# Patient Record
Sex: Male | Born: 1975 | Race: Black or African American | Hispanic: No | Smoking: Former smoker
Health system: Southern US, Community
[De-identification: ages and names within clinical notes are randomized; demographics above are authoritative.]

## PROBLEM LIST (undated history)

## (undated) DIAGNOSIS — E785 Hyperlipidemia, unspecified: Secondary | ICD-10-CM

## (undated) DIAGNOSIS — E119 Type 2 diabetes mellitus without complications: Secondary | ICD-10-CM

## (undated) DIAGNOSIS — I10 Essential (primary) hypertension: Secondary | ICD-10-CM

## (undated) DIAGNOSIS — H5712 Ocular pain, left eye: Secondary | ICD-10-CM

## (undated) HISTORY — DX: Hyperlipidemia, unspecified: E78.5

## (undated) HISTORY — PX: HERNIA REPAIR: SHX51

## (undated) HISTORY — DX: Essential (primary) hypertension: I10

## (undated) HISTORY — DX: Type 2 diabetes mellitus without complications: E11.9

---

## 2016-11-15 ENCOUNTER — Encounter (HOSPITAL_COMMUNITY): Payer: Self-pay | Admitting: Emergency Medicine

## 2016-11-15 ENCOUNTER — Emergency Department (HOSPITAL_COMMUNITY)
Admission: EM | Admit: 2016-11-15 | Discharge: 2016-11-15 | Disposition: A | Discharge status: Home / Self Care | Payer: PRIVATE HEALTH INSURANCE | Attending: Emergency Medicine | Admitting: Emergency Medicine

## 2016-11-15 DIAGNOSIS — Y929 Unspecified place or not applicable: Secondary | ICD-10-CM | POA: Diagnosis not present

## 2016-11-15 DIAGNOSIS — Y999 Unspecified external cause status: Secondary | ICD-10-CM | POA: Insufficient documentation

## 2016-11-15 DIAGNOSIS — Z87891 Personal history of nicotine dependence: Secondary | ICD-10-CM | POA: Diagnosis not present

## 2016-11-15 DIAGNOSIS — X58XXXA Exposure to other specified factors, initial encounter: Secondary | ICD-10-CM | POA: Insufficient documentation

## 2016-11-15 DIAGNOSIS — Y939 Activity, unspecified: Secondary | ICD-10-CM | POA: Insufficient documentation

## 2016-11-15 DIAGNOSIS — S0502XA Injury of conjunctiva and corneal abrasion without foreign body, left eye, initial encounter: Secondary | ICD-10-CM | POA: Diagnosis not present

## 2016-11-15 DIAGNOSIS — H5712 Ocular pain, left eye: Secondary | ICD-10-CM | POA: Diagnosis present

## 2016-11-15 HISTORY — DX: Ocular pain, left eye: H57.12

## 2016-11-15 MED ORDER — FLUORESCEIN SODIUM 0.6 MG OP STRP: 1.0000 | ORAL_STRIP | Freq: Once | OPHTHALMIC | Status: DC

## 2016-11-15 MED ORDER — TOBRAMYCIN 0.3 % OP SOLN: 1.0000 [drp] | OPHTHALMIC | 0 refills | Status: DC

## 2016-11-15 NOTE — ED Provider Notes (Signed)
[facility]EMERGENCY DEPT Provider Note   CSN: [id number] Arrival date & time: 11/15/16  1054     History   Chief Complaint Chief Complaint  Patient presents with  . Eye Pain    HPI Nicholas Bradley is a 41 y.o. male.  The history is provided by the patient.  Eye Pain  This is a new problem. The problem occurs constantly. The problem has not changed since onset.Nothing aggravates the symptoms. Nothing relieves the symptoms. He has tried nothing for the symptoms.   Pt reports he has redness and pain to his left eye.  Pt complains of tearing, increased pain in the light.  Pt works on cars.  Pt admits to possible foreign body exposure Past Medical History:  Diagnosis Date  . Left eye pain     There are no active problems to display for this patient.   Past Surgical History:  Procedure Laterality Date  . HERNIA REPAIR         Home Medications    Prior to Admission medications   Medication Sig Start Date End Date Taking? Authorizing Provider  tobramycin (TOBREX) 0.3 % ophthalmic solution Place 1 drop into the left eye every 4 (four) hours. 11/15/16   Elson Areas, PA-C    Family History History reviewed. No pertinent family history.  Social History Social History  Substance Use Topics  . Smoking status: Former Smoker    Quit date: 11/14/2014  . Smokeless tobacco: Never Used  . Alcohol use Not on file     Comment: quit 2013     Allergies   Patient has no allergy information on record.   Review of Systems Review of Systems  Eyes: Positive for pain.  All other systems reviewed and are negative.    Physical Exam Updated Vital Signs BP (!) 136/97 (BP Location: Right Arm)   Pulse 78   Temp 98.8 F (37.1 C) (Oral)   Resp 18   Wt 129.3 kg (285 lb)   SpO2 98%   Physical Exam  Constitutional: He appears well-developed and well-nourished.  HENT:  Head: Normocephalic and atraumatic.  Eyes: Conjunctivae are normal. Right eye exhibits discharge.  Injected,  tearing, fluro small area of uptake at 6 oclock   Neck: Neck supple.  Cardiovascular: Normal rate and regular rhythm.   No murmur heard. Pulmonary/Chest: Effort normal and breath sounds normal. No respiratory distress.  Abdominal: There is no tenderness.  Musculoskeletal: He exhibits no edema.  Neurological: He is alert.  Skin: Skin is warm and dry.  Psychiatric: He has a normal mood and affect.  Nursing note and vitals reviewed.    ED Treatments / Results  Labs (all labs ordered are listed, but only abnormal results are displayed) Labs Reviewed - No data to display  EKG  EKG Interpretation None       Radiology No results found.  Procedures Procedures (including critical care time)  Medications Ordered in ED Medications  fluorescein ophthalmic strip 1 strip (not administered)     Initial Impression / Assessment and Plan / ED Course  I have reviewed the triage vital signs and the nursing notes.  Pertinent labs & imaging results that were available during my care of the patient were reviewed by me and considered in my medical decision making (see chart for details).       Final Clinical Impressions(s) / ED Diagnoses   Final diagnoses:  Abrasion of left cornea, initial encounter    New Prescriptions New Prescriptions   TOBRAMYCIN (  TOBREX) 0.3 % OPHTHALMIC SOLUTION    Place 1 drop into the left eye every 4 (four) hours.   An After Visit Summary was printed and given to the patient.    Elson AreasSofia, Madason Rauls K, [place]PA-C 11/15/16 1155    Doug SouJacubowitz, Sam, MD 11/15/16 [phone]

## 2016-11-15 NOTE — Discharge Instructions (Signed)
Return if any problems.

## 2017-05-22 ENCOUNTER — Other Ambulatory Visit: Payer: Self-pay

## 2017-05-22 ENCOUNTER — Encounter (HOSPITAL_COMMUNITY): Payer: Self-pay | Admitting: *Deleted

## 2017-05-22 ENCOUNTER — Emergency Department (HOSPITAL_COMMUNITY): Payer: PRIVATE HEALTH INSURANCE

## 2017-05-22 ENCOUNTER — Emergency Department (HOSPITAL_COMMUNITY)
Admission: EM | Admit: 2017-05-22 | Discharge: 2017-05-22 | Disposition: A | Discharge status: Home / Self Care | Payer: PRIVATE HEALTH INSURANCE | Attending: Emergency Medicine | Admitting: Emergency Medicine

## 2017-05-22 DIAGNOSIS — Z87891 Personal history of nicotine dependence: Secondary | ICD-10-CM | POA: Insufficient documentation

## 2017-05-22 DIAGNOSIS — R202 Paresthesia of skin: Secondary | ICD-10-CM | POA: Insufficient documentation

## 2017-05-22 DIAGNOSIS — M79642 Pain in left hand: Secondary | ICD-10-CM | POA: Diagnosis present

## 2017-05-22 NOTE — ED Provider Notes (Signed)
[facility] EMERGENCY DEPARTMENT Provider Note   CSN: [id number] Arrival date & time: 05/22/17  0013     History   Chief Complaint Chief Complaint  Patient presents with  . Hand Pain    HPI Nicholas Bradley is a 41 y.o. male.  The history is provided by the patient.  Hand Pain  This is a recurrent problem. The current episode started more than 1 week ago. The problem has been gradually worsening. Pertinent negatives include no chest pain and no shortness of breath. Nothing aggravates the symptoms. Nothing relieves the symptoms.  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 Left eye pain     There are no active problems to display for this patient.   Past Surgical History:  Procedure Laterality Date  . HERNIA REPAIR         Home Medications    Prior to Admission medications   Medication Sig Start Date End Date Taking? Authorizing Provider  tobramycin (TOBREX) 0.3 % ophthalmic solution Place 1 drop into the left eye every 4 (four) hours. 11/15/16   Elson AreasSofia, Leslie K, PA-C    Family History History reviewed. No pertinent family history.  Social History Social History   Tobacco Use  . Smoking status: Former Smoker    Last attempt to quit: 11/14/2014    Years since quitting: 2.5  . Smokeless tobacco: Never Used  Substance Use Topics  . Alcohol use: No    Frequency: Never    Comment: quit 2013  . Drug use: No     Allergies   Patient has no known allergies.   Review of Systems Review of Systems  Constitutional: Negative for fever.    Respiratory: Negative for shortness of breath.   Cardiovascular: Negative for chest pain.  Neurological: Positive for numbness.     Physical Exam Updated Vital Signs BP 122/85 (BP Location: Right Arm)   Pulse 70   Temp 98 F (36.7 C) (Oral)   Resp 18   Ht 1.803 m (5\' 11" )   Wt 127 kg (280 lb)   SpO2 98%   BMI 39.05 kg/m   Physical Exam CONSTITUTIONAL: Well developed/well nourished HEAD: Normocephalic/atraumatic ENMT: Mucous membranes moist NECK: supple no meningeal signs CV: S1/S2 noted, no murmurs/rubs/gallops noted LUNGS: Lungs are clear to auscultation bilaterally, no apparent distress ABDOMEN: soft NEURO: Pt is awake/alert/appropriate, moves all extremitiesx4.  No facial droop.   Distal radial/media/ulnar motor/sensory intact on left hand.  Equal hand grips noted EXTREMITIES: pulses normal/equal, full ROM.  There is no tenderness to palpation of left hand.  See photo.  No foreign bodies were palpated during exam SKIN: warm, color normal PSYCH: no abnormalities of mood noted, alert and oriented to situation    Patient gave verbal permission to utilize photo for medical documentation only The image was not stored on any personal device ED Treatments / Results  Labs (all labs ordered are listed, but only abnormal results are displayed) Labs Reviewed - No data to display  EKG  EKG Interpretation None  Radiology Dg Hand Complete Left  Result Date: 05/22/2017 CLINICAL DATA:  Left hand pain and numbness. EXAM: LEFT HAND - COMPLETE 3+ VIEW COMPARISON:  None. FINDINGS: There is no evidence of fracture or dislocation. There is no evidence of arthropathy or other focal bone abnormality. Punctate foreign body in the radial soft tissues of the index finger adjacent the proximal phalanx. IMPRESSION: 1. No osseous abnormality. 2. Punctate foreign body in the index finger soft tissues, of uncertain acuity. Electronically Signed   By: Rubye OaksMelanie  Ehinger M.D.   On:  05/22/2017 01:24    Procedures Procedures (including critical care time)  Medications Ordered in ED Medications - No data to display   Initial Impression / Assessment and Plan / ED Course  I have reviewed the triage vital signs and the nursing notes.  Pertinent  imaging results that were available during my care of the patient were reviewed by me and considered in my medical decision making (see chart for details).     Reviewed imaging with patient, and could not palpate a foreign body over the one that is indicated on x-ray  Area that he was most concerned about did not reveal any foreign body by x-ray  At this point there is no signs of infectious etiology, he has no focal weakness in his hands noted However he does say that he has frequent episodes of numbness and pain/weakness in his hand left hand while working  Suspect this is related to his occupation Likely this is some sort of peripheral neuropathy  We will refer him to the hand center for follow-up  Final Clinical Impressions(s) / ED Diagnoses   Final diagnoses:  Paresthesia    ED Discharge Orders    None       Zadie RhineWickline, Basir Niven, MD 05/22/17 [id number]

## 2017-05-22 NOTE — ED Triage Notes (Signed)
Pt c/o pain and numbness to left hand; pt states he has sharp pains intermittently to left ring finger and states it feels like there is a foreign body in his thumb

## 2018-04-18 ENCOUNTER — Emergency Department (HOSPITAL_COMMUNITY)
Admission: EM | Admit: 2018-04-18 | Discharge: 2018-04-18 | Disposition: A | Discharge status: Home / Self Care | Payer: BLUE CROSS/BLUE SHIELD | Attending: Emergency Medicine | Admitting: Emergency Medicine

## 2018-04-18 ENCOUNTER — Other Ambulatory Visit: Payer: Self-pay

## 2018-04-18 ENCOUNTER — Encounter (HOSPITAL_COMMUNITY): Payer: Self-pay

## 2018-04-18 ENCOUNTER — Emergency Department (HOSPITAL_COMMUNITY): Payer: BLUE CROSS/BLUE SHIELD

## 2018-04-18 DIAGNOSIS — Z87891 Personal history of nicotine dependence: Secondary | ICD-10-CM | POA: Insufficient documentation

## 2018-04-18 DIAGNOSIS — Z79899 Other long term (current) drug therapy: Secondary | ICD-10-CM | POA: Insufficient documentation

## 2018-04-18 DIAGNOSIS — K654 Sclerosing mesenteritis: Secondary | ICD-10-CM | POA: Insufficient documentation

## 2018-04-18 DIAGNOSIS — Z0489 Encounter for examination and observation for other specified reasons: Secondary | ICD-10-CM | POA: Diagnosis not present

## 2018-04-18 DIAGNOSIS — K429 Umbilical hernia without obstruction or gangrene: Secondary | ICD-10-CM | POA: Diagnosis present

## 2018-04-18 DIAGNOSIS — N400 Enlarged prostate without lower urinary tract symptoms: Secondary | ICD-10-CM | POA: Diagnosis not present

## 2018-04-18 DIAGNOSIS — Z139 Encounter for screening, unspecified: Secondary | ICD-10-CM

## 2018-04-18 LAB — COMPREHENSIVE METABOLIC PANEL
ALK PHOS: 73 U/L (ref 38–126)
ALT: 48 U/L — ABNORMAL HIGH (ref 0–44)
ANION GAP: 6 (ref 5–15)
AST: 33 U/L (ref 15–41)
Albumin: 4 g/dL (ref 3.5–5.0)
BUN: 19 mg/dL (ref 6–20)
CALCIUM: 9.3 mg/dL (ref 8.9–10.3)
CO2: 25 mmol/L (ref 22–32)
Chloride: 105 mmol/L (ref 98–111)
Creatinine, Ser: 0.87 mg/dL (ref 0.61–1.24)
Glucose, Bld: 116 mg/dL — ABNORMAL HIGH (ref 70–99)
Potassium: 3.6 mmol/L (ref 3.5–5.1)
SODIUM: 136 mmol/L (ref 135–145)
Total Bilirubin: 0.7 mg/dL (ref 0.3–1.2)
Total Protein: 7.7 g/dL (ref 6.5–8.1)

## 2018-04-18 LAB — CBC WITH DIFFERENTIAL/PLATELET
Abs Immature Granulocytes: 0.01 10*3/uL (ref 0.00–0.07)
BASOS PCT: 0 %
Basophils Absolute: 0 10*3/uL (ref 0.0–0.1)
EOS ABS: 0.1 10*3/uL (ref 0.0–0.5)
EOS PCT: 1 %
HCT: 43.7 % (ref 39.0–52.0)
HEMOGLOBIN: 14.7 g/dL (ref 13.0–17.0)
Immature Granulocytes: 0 %
LYMPHS PCT: 44 %
Lymphs Abs: 3.3 10*3/uL (ref 0.7–4.0)
MCH: 30.5 pg (ref 26.0–34.0)
MCHC: 33.6 g/dL (ref 30.0–36.0)
MCV: 90.7 fL (ref 80.0–100.0)
Monocytes Absolute: 0.9 10*3/uL (ref 0.1–1.0)
Monocytes Relative: 12 %
NRBC: 0 % (ref 0.0–0.2)
Neutro Abs: 3.3 10*3/uL (ref 1.7–7.7)
Neutrophils Relative %: 43 %
PLATELETS: 209 10*3/uL (ref 150–400)
RBC: 4.82 MIL/uL (ref 4.22–5.81)
RDW: 12.7 % (ref 11.5–15.5)
WBC: 7.6 10*3/uL (ref 4.0–10.5)

## 2018-04-18 LAB — LIPASE, BLOOD: LIPASE: 41 U/L (ref 11–51)

## 2018-04-18 MED ORDER — IOPAMIDOL (ISOVUE-300) INJECTION 61%
100.0000 mL | Freq: Once | INTRAVENOUS | Status: AC | PRN
  Administered 2018-04-18: 100 mL via INTRAVENOUS

## 2018-04-18 NOTE — ED Triage Notes (Signed)
Pt had a hernia surgery a few years ago. States now it is harder than it usually is. Also has had issues with taking a BM for the last few days. Not painful.

## 2018-04-18 NOTE — ED Provider Notes (Signed)
[facility] EMERGENCY DEPARTMENT Provider Note   CSN: [id number] Arrival date & time: 04/18/18  1039     History   Chief Complaint Chief Complaint  Patient presents with  . Hernia    HPI Zahari Trentman is a 42 y.o. male.  HPI  Pt was seen at 1150. Per pt, c/o gradual onset and persistence of constant "hernia area harder than usual" for the past several days. Pt states he has hx of umbilical hernia repair, and "it feels like it's harder than it was." Denies pain at site. Pt states he noticed it "because a friend was going to have hernia surgery and I was showing him mine." Pt also states he was constipated for the past 3 days, but this resolved today when he had a usual BM. Denies abd pain, no N/V/D, no back pain, no CP/SOB, no fevers, no rash, no injury, no testicular pain/swelling, no dysuria/hematuria.   Past Medical History:  Diagnosis Date  . Left eye pain     There are no active problems to display for this patient.   Past Surgical History:  Procedure Laterality Date  . HERNIA REPAIR          Home Medications    Prior to Admission medications   Not on File    Family History No family history on file.  Social History Social History   Tobacco Use  . Smoking status: Former Smoker    Last attempt to quit: 11/14/2014    Years since quitting: 3.4  . Smokeless tobacco: Never Used  Substance Use Topics  . Alcohol use: No    Frequency: Never    Comment: quit 2013  . Drug use: No     Allergies   Patient has no known allergies.   Review of Systems Review of Systems ROS: Statement: All systems negative except as marked or noted in the HPI; Constitutional: Negative for fever and chills. ; ; Eyes: Negative for eye pain, redness and discharge. ; ; ENMT: Negative for ear pain, hoarseness, nasal congestion, sinus pressure and sore throat. ; ; Cardiovascular: Negative for chest pain, palpitations, diaphoresis, dyspnea and peripheral edema. ; ; Respiratory:  Negative for cough, wheezing and stridor. ; ; Gastrointestinal: +"hernia area feels hard." Negative for nausea, vomiting, diarrhea, abdominal pain, blood in stool, hematemesis, jaundice and rectal bleeding. . ; ; Genitourinary: Negative for dysuria, flank pain and hematuria. ; ; GYN:  No pelvic pain, no vaginal bleeding, no vaginal discharge, no vulvar pain. ;; Musculoskeletal: Negative for back pain and neck pain. Negative for swelling and trauma.; ; Skin: Negative for pruritus, rash, abrasions, blisters, bruising and skin lesion.; ; Neuro: Negative for headache, lightheadedness and neck stiffness. Negative for weakness, altered level of consciousness, altered mental status, extremity weakness, paresthesias, involuntary movement, seizure and syncope.       Physical Exam Updated Vital Signs BP (!) 146/89 (BP Location: Right Arm)   Pulse 77   Temp 97.8 F (36.6 C) (Oral)   Resp 12   Ht 5\' 10"  (1.778 m)   Wt 131.5 kg   SpO2 98%   BMI 41.61 kg/m    Physical Exam 1155: Physical examination:  Nursing notes reviewed; Vital signs and O2 SAT reviewed;  Constitutional: Well developed, Well nourished, Well hydrated, In no acute distress; Head:  Normocephalic, atraumatic; Eyes: EOMI, PERRL, No scleral icterus; ENMT: Mouth and pharynx normal, Mucous membranes moist; Neck: Supple, Full range of motion, No lymphadenopathy; Cardiovascular: Regular rate and rhythm, No gallop; Respiratory: Breath  sounds clear & equal bilaterally, No wheezes.  Speaking full sentences with ease, Normal respiratory effort/excursion; Chest: Nontender, Movement normal; Abdomen: Soft, Nontender, Nondistended, Normal bowel sounds. No palp umbilical hernia. No erythema or ecchymosis of abd wall.;; Genitourinary: No CVA tenderness; Extremities: Peripheral pulses normal, No tenderness, No edema, No calf edema or asymmetry.; Neuro: AA&Ox3, Major CN grossly intact.  Speech clear. No gross focal motor or sensory deficits in extremities.;  Skin: Color normal, Warm, Dry.     ED Treatments / Results  Labs (all labs ordered are listed, but only abnormal results are displayed)   EKG None  Radiology   Procedures Procedures (including critical care time)  Medications Ordered in ED Medications  iopamidol (ISOVUE-300) 61 % injection 100 mL (100 mLs Intravenous Contrast Given 04/18/18 1328)     Initial Impression / Assessment and Plan / ED Course  I have reviewed the triage vital signs and the nursing notes.  Pertinent labs & imaging results that were available during my care of the patient were reviewed by me and considered in my medical decision making (see chart for details).  MDM Reviewed: previous chart, nursing note and vitals Reviewed previous: labs Interpretation: labs and CT scan   Results for orders placed or performed during the hospital encounter of 04/18/18  Comprehensive metabolic panel  Result Value Ref Range   Sodium 136 135 - 145 mmol/L   Potassium 3.6 3.5 - 5.1 mmol/L   Chloride 105 98 - 111 mmol/L   CO2 25 22 - 32 mmol/L   Glucose, Bld 116 (H) 70 - 99 mg/dL   BUN 19 6 - 20 mg/dL   Creatinine, Ser 1.61 0.61 - 1.24 mg/dL   Calcium 9.3 8.9 - 09.6 mg/dL   Total Protein 7.7 6.5 - 8.1 g/dL   Albumin 4.0 3.5 - 5.0 g/dL   AST 33 15 - 41 U/L   ALT 48 (H) 0 - 44 U/L   Alkaline Phosphatase 73 38 - 126 U/L   Total Bilirubin 0.7 0.3 - 1.2 mg/dL   GFR calc non Af Amer >60 >60 mL/min   GFR calc Af Amer >60 >60 mL/min   Anion gap 6 5 - 15  Lipase, blood  Result Value Ref Range   Lipase 41 11 - 51 U/L  CBC with Differential  Result Value Ref Range   WBC 7.6 4.0 - 10.5 K/uL   RBC 4.82 4.22 - 5.81 MIL/uL   Hemoglobin 14.7 13.0 - 17.0 g/dL   HCT 04.5 40.9 - 81.1 %   MCV 90.7 80.0 - 100.0 fL   MCH 30.5 26.0 - 34.0 pg   MCHC 33.6 30.0 - 36.0 g/dL   RDW 91.4 78.2 - 95.6 %   Platelets 209 150 - 400 K/uL   nRBC 0.0 0.0 - 0.2 %   Neutrophils Relative % 43 %   Neutro Abs 3.3 1.7 - 7.7 K/uL    Lymphocytes Relative 44 %   Lymphs Abs 3.3 0.7 - 4.0 K/uL   Monocytes Relative 12 %   Monocytes Absolute 0.9 0.1 - 1.0 K/uL   Eosinophils Relative 1 %   Eosinophils Absolute 0.1 0.0 - 0.5 K/uL   Basophils Relative 0 %   Basophils Absolute 0.0 0.0 - 0.1 K/uL   Immature Granulocytes 0 %   Abs Immature Granulocytes 0.01 0.00 - 0.07 K/uL   Ct Abdomen Pelvis W Contrast Result Date: 04/18/2018 CLINICAL DATA:  Abdomen pain. History of hernia repair. EXAM: CT ABDOMEN AND PELVIS WITH CONTRAST  TECHNIQUE: Multidetector CT imaging of the abdomen and pelvis was performed using the standard protocol following bolus administration of intravenous contrast. CONTRAST:  100mL ISOVUE-300 IOPAMIDOL (ISOVUE-300) INJECTION 61% COMPARISON:  None. FINDINGS: Lower chest: No acute abnormality. Hepatobiliary: Steatosis of the liver without space-occupying mass. Contracted gallbladder without stones. No biliary dilatation. Pancreas: Unremarkable. No pancreatic ductal dilatation or surrounding inflammatory changes. Spleen: Normal in size without focal abnormality. Adrenals/Urinary Tract: Adrenal glands are unremarkable. Kidneys are normal, without renal calculi, focal lesion, or hydronephrosis. Bladder is unremarkable. Stomach/Bowel: Stomach is within normal limits. Appendix appears normal. No evidence of bowel wall thickening, distention, or inflammatory changes. Vascular/Lymphatic: Faint hazy appearance of the mesenteric fat at the root of the mesentery with small mesenteric lymph nodes. 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 Nonaneurysmal thoracic aorta. Minimal atherosclerosis of the common iliac arteries. Reproductive: The prostate is mildly enlarged measuring approximately 5.3 x 4.1 x 5 cm and heterogeneous in appearance, more hypoechoic along the right peripheral zone. Asymmetric enlargement of what appears to be the right seminal vesicle relative to left, series 2/82. Clinical correlation is recommended.  Other: No abdominal wall hernia or abnormality. No apparent recurrence of hernia. No abdominopelvic ascites. Musculoskeletal: No acute or significant osseous findings. Mild soft tissue induration along the lateral aspect both hips. IMPRESSION: 1. Hepatic steatosis. 2. Hazy appearance of the mesenteric fat at the root of the mesentery with small mesenteric lymph nodes. 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 3. Enlargement of the prostate with asymmetric enlargement of what appears to be the right seminal vesicle relative to left. Clinical correlation is recommended. Electronically Signed   By: Tollie Ethavid  Kwon M.D.   On: 04/18/2018 13:45     1400:  Abd remains benign, resps easy, VSS. Has tol PO well while in the ED without N/V.  CT without acute surgical findings. T/C returned from General Surgery Dr. 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  T/C returned from Urology Dr. Sande BrothersWinters, case discussed, including:  HPI, pertinent PM/SHx, VS/PE, dx testing, ED course and treatment:  Pt will need f/u in office for prostate/siminal vesicle findings, no acute tx at this time. Dx and testing, as well as d/w Uro MD and General Surgeon, d/w pt.  Questions answered.  Verb understanding, agreeable to d/c home with outpt f/u.     Final Clinical Impressions(s) / ED Diagnoses   Final diagnoses:  None    ED Discharge Orders    None       Samuel JesterMcManus, Jemiah Ellenburg, DO 04/23/18 1613

## 2018-04-18 NOTE — Discharge Instructions (Addendum)
Your CT scan showed an incidental finding(s):  "Faint hazy appearance of the mesenteric fat at the root of the mesentery with small mesenteric lymph nodes. 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 Asymmetric enlargement of what appears to be the right seminal vesicle relative to left." Take your usual prescriptions as previously directed.  Call your regular medical doctor today to schedule a follow up appointment within the next 3 days. Call the Urologist today to schedule a follow up appointment within the next week for your prostate findings.  Return to the Emergency Department immediately sooner if worsening.

## 2018-12-24 ENCOUNTER — Other Ambulatory Visit: Payer: Self-pay

## 2018-12-24 ENCOUNTER — Encounter (HOSPITAL_COMMUNITY): Payer: Self-pay | Admitting: Emergency Medicine

## 2018-12-24 ENCOUNTER — Emergency Department (HOSPITAL_COMMUNITY)
Admission: EM | Admit: 2018-12-24 | Discharge: 2018-12-24 | Disposition: A | Discharge status: Home / Self Care | Payer: PRIVATE HEALTH INSURANCE | Attending: Emergency Medicine | Admitting: Emergency Medicine

## 2018-12-24 DIAGNOSIS — H538 Other visual disturbances: Secondary | ICD-10-CM | POA: Insufficient documentation

## 2018-12-24 DIAGNOSIS — Z87891 Personal history of nicotine dependence: Secondary | ICD-10-CM | POA: Insufficient documentation

## 2018-12-24 DIAGNOSIS — H16002 Unspecified corneal ulcer, left eye: Secondary | ICD-10-CM | POA: Insufficient documentation

## 2018-12-24 MED ORDER — TETANUS-DIPHTH-ACELL PERTUSSIS 5-2.5-18.5 LF-MCG/0.5 IM SUSP: 0.5000 mL | Freq: Once | INTRAMUSCULAR | Status: DC

## 2018-12-24 MED ORDER — GATIFLOXACIN 0.5 % OP SOLN
1.0000 [drp] | OPHTHALMIC | Status: DC
Start: 2018-12-24 — End: 2018-12-24
  Administered 2018-12-24: 06:00:00 1 [drp] via OPHTHALMIC
  Filled 2018-12-24 (×2): qty 2.5

## 2018-12-24 MED ORDER — MOXIFLOXACIN HCL 0.5 % OP SOLN: 1.0000 [drp] | Freq: Three times a day (TID) | OPHTHALMIC | 0 refills | Status: AC

## 2018-12-24 MED ORDER — TETRACAINE HCL 0.5 % OP SOLN
2.0000 [drp] | Freq: Once | OPHTHALMIC | Status: AC
  Administered 2018-12-24: 06:00:00 2 [drp] via OPHTHALMIC
  Filled 2018-12-24: qty 4

## 2018-12-24 MED ORDER — FLUORESCEIN SODIUM 1 MG OP STRP
1.0000 | ORAL_STRIP | Freq: Once | OPHTHALMIC | Status: AC
Start: 2018-12-24 — End: 2018-12-24
  Administered 2018-12-24: 06:00:00 1 via OPHTHALMIC
  Filled 2018-12-24: qty 1

## 2018-12-24 NOTE — ED Triage Notes (Signed)
Pt says that he had rust cut into his eye about 2 years ago and ever since it flares up every now and then. Pt says that last night his left eye was burning but then this morning he could not open his eye. Left eye red and swollen at this time.

## 2018-12-24 NOTE — ED Provider Notes (Signed)
[facility] EMERGENCY DEPARTMENT Provider Note   CSN: [id number] Arrival date & time: 12/24/18  8338     History   Chief Complaint Chief Complaint  Patient presents with  . Eye Problem    HPI Nicholas Bradley is a 43 y.o. male.     Patient here with left eye pain, drainage and swelling and blurry vision since waking up this morning at 4:00.  States he had an injury to his eye 2 years ago with a "piece of rust that cut it".  He works as a Dealer on dump trucks.  He states he was treated in the ED and never followed up with an eye doctor.  Since then he has had irritation and redness to his left eye from time to time.  This morning is the first time when he has had blurry vision and drainage and he could not open his eyeball this morning.  He states everything looks cloudy and he had difficulty opening his eye this morning.  He does not wear glasses or contacts.  He states this is the first time he said blurry vision and difficulty opening his eye.  He has not seen an eye doctor since his initial injury.  There is been no fever chills, nausea or vomiting.  The history is provided by the patient.  Eye Problem Associated symptoms: discharge, photophobia and redness   Associated symptoms: no headaches, no nausea, no vomiting and no weakness     Past Medical History:  Diagnosis Date  . Left eye pain     There are no active problems to display for this patient.   Past Surgical History:  Procedure Laterality Date  . HERNIA REPAIR          Home Medications    Prior to Admission medications   Not on File    Family History No family history on file.  Social History Social History   Tobacco Use  . Smoking status: Former Smoker    Quit date: 11/14/2014    Years since quitting: 4.1  . Smokeless tobacco: Never Used  Substance Use Topics  . Alcohol use: No    Frequency: Never    Comment: quit 2013  . Drug use: No     Allergies   Patient has no known allergies.    Review of Systems Review of Systems  Constitutional: Negative for activity change, appetite change and fever.  HENT: Negative for congestion.   Eyes: Positive for photophobia, pain, discharge, redness and visual disturbance.  Respiratory: Negative for cough, chest tightness and shortness of breath.   Cardiovascular: Negative for chest pain.  Gastrointestinal: Negative for abdominal pain, nausea and vomiting.  Genitourinary: Negative for dysuria and hematuria.  Musculoskeletal: Negative for arthralgias.  Skin: Negative for rash.  Neurological: Negative for dizziness, weakness and headaches.   all other systems are negative except as noted in the HPI and PMH.     Physical Exam Updated Vital Signs BP (!) 162/100   Pulse 80   Temp 98.1 F (36.7 C) (Oral)   Resp 20   Ht 5\' 11"  (1.803 m)   Wt (!) 142.9 kg   SpO2 96%   BMI 43.93 kg/m   Physical Exam Vitals signs and nursing note reviewed.  Constitutional:      General: He is not in acute distress.    Appearance: He is well-developed.  HENT:     Head: Normocephalic and atraumatic.     Mouth/Throat:     Pharynx: No  oropharyngeal exudate.  Eyes:     General: Lids are normal. Lids are everted, no foreign bodies appreciated.        Left eye: No foreign body.     Intraocular pressure: Left eye pressure is 20 mmHg.     Extraocular Movements: Extraocular movements intact.     Conjunctiva/sclera:     Left eye: Left conjunctiva is injected. Chemosis present.     Pupils: Pupils are equal, round, and reactive to light.     Left eye: Pupil is sluggish. Corneal abrasion and fluorescein uptake present. Seidel exam negative.    Funduscopic exam:       Left eye: No papilledema.     Slit lamp exam:    Left eye: Corneal ulcer and photophobia present. No hyphema or hypopyon.      Comments: Visible defect to cornea with +fluroscein uptake  Neck:     Musculoskeletal: Normal range of motion and neck supple.     Comments: No meningismus.  Cardiovascular:     Rate and Rhythm: Normal rate and regular rhythm.     Heart sounds: Normal heart sounds. No murmur.  Pulmonary:     Effort: Pulmonary effort is normal. No respiratory distress.     Breath sounds: Normal breath sounds.  Abdominal:     Palpations: Abdomen is soft.     Tenderness: There is no abdominal tenderness. There is no guarding or rebound.  Musculoskeletal: Normal range of motion.        General: No tenderness.  Skin:    General: Skin is warm.  Neurological:     Mental Status: He is alert and oriented to person, place, and time.     Cranial Nerves: No cranial nerve deficit.     Motor: No abnormal muscle tone.     Coordination: Coordination normal.     Comments: No ataxia on finger to nose bilaterally. No pronator drift. 5/5 strength throughout. CN 2-12 intact.Equal grip strength. Sensation intact.   Psychiatric:        Behavior: Behavior normal.        ED Treatments / Results  Labs (all labs ordered are listed, but only abnormal results are displayed) Labs Reviewed - No data to display  EKG None  Radiology No results found.  Procedures Procedures (including critical care time)  Medications Ordered in ED Medications  fluorescein ophthalmic strip 1 strip (has no administration in time range)  tetracaine (PONTOCAINE) 0.5 % ophthalmic solution 2 drop (has no administration in time range)     Initial Impression / Assessment and Plan / ED Course  I have reviewed the triage vital signs and the nursing notes.  Pertinent labs & imaging results that were available during my care of the patient were reviewed by me and considered in my medical decision making (see chart for details).       Patient with left eye pain, blurry vision and obvious defect to cornea with fluorescein uptake present.  Concern for corneal ulceration versus early endophthalmitis.  He has no pain with extraocular movements.  His intraocular pressures are normal.  We will  update tetanus.  Will start topical antibiotics.  Discussed with Dr. Dione BoozeGroat ophthalmology.  He agrees with starting antibiotics giving 1 drop every hour until he is seen in the office.  There is some concern for early endophthalmitis. He states patient can come directly to office at 8am.  Patient agrees to go directly to the ophthalmology office at 8 AM.  He understands to use  the antibiotic drops every hour.  He understands he could have a serious infection which could be vision threatening.  He knows not to eat or drink on the way.  His wife will be driving him directly to the office.   Final Clinical Impressions(s) / ED Diagnoses   Final diagnoses:  Ulcer of left cornea    ED Discharge Orders    None       Theona Muhs, Jeannett SeniorStephen, MD 12/24/18 [phone]

## 2018-12-24 NOTE — Discharge Instructions (Addendum)
There is concern that you could have a serious eye infection which can threaten your vision.  Go directly to Dr. Zenia Resides office today at 8 AM. Tell them you were seen in the ED and Dr. Katy Fitch is expecting you. Do not eat or drink on the way.  Use the eyedrops every hour until he sees you in the office.  Return to the ED if you develop new or worsening symptoms.

## 2020-01-20 ENCOUNTER — Emergency Department (HOSPITAL_COMMUNITY)
Admission: EM | Admit: 2020-01-20 | Discharge: 2020-01-20 | Disposition: A | Discharge status: Home / Self Care | Payer: HRSA Program | Attending: Emergency Medicine | Admitting: Emergency Medicine

## 2020-01-20 ENCOUNTER — Encounter (HOSPITAL_COMMUNITY): Payer: Self-pay | Admitting: *Deleted

## 2020-01-20 ENCOUNTER — Emergency Department (HOSPITAL_COMMUNITY): Payer: HRSA Program

## 2020-01-20 ENCOUNTER — Other Ambulatory Visit: Payer: Self-pay

## 2020-01-20 DIAGNOSIS — Z87891 Personal history of nicotine dependence: Secondary | ICD-10-CM | POA: Diagnosis not present

## 2020-01-20 DIAGNOSIS — R509 Fever, unspecified: Secondary | ICD-10-CM | POA: Diagnosis present

## 2020-01-20 DIAGNOSIS — U071 COVID-19: Secondary | ICD-10-CM | POA: Insufficient documentation

## 2020-01-20 LAB — SARS CORONAVIRUS 2 BY RT PCR (HOSPITAL ORDER, PERFORMED IN ~~LOC~~ HOSPITAL LAB): SARS Coronavirus 2: POSITIVE — AB

## 2020-01-20 MED ORDER — ALBUTEROL SULFATE HFA 108 (90 BASE) MCG/ACT IN AERS
4.0000 | INHALATION_SPRAY | Freq: Once | RESPIRATORY_TRACT | Status: AC
  Administered 2020-01-20: 4 via RESPIRATORY_TRACT
  Filled 2020-01-20: qty 6.7

## 2020-01-20 MED ORDER — SODIUM CHLORIDE 0.9 % IV BOLUS
1000.0000 mL | Freq: Once | INTRAVENOUS | Status: AC
  Administered 2020-01-20: 1000 mL via INTRAVENOUS

## 2020-01-20 MED ORDER — ACETAMINOPHEN 325 MG PO TABS
650.0000 mg | ORAL_TABLET | Freq: Once | ORAL | Status: AC | PRN
  Administered 2020-01-20: 650 mg via ORAL
  Filled 2020-01-20: qty 2

## 2020-01-20 MED ORDER — AEROCHAMBER PLUS FLO-VU MISC
1.0000 | Freq: Once | Status: AC
Start: 2020-01-20 — End: 2020-01-20
  Administered 2020-01-20: 1
  Filled 2020-01-20: qty 1

## 2020-01-20 MED ORDER — KETOROLAC TROMETHAMINE 30 MG/ML IJ SOLN
30.0000 mg | Freq: Once | INTRAMUSCULAR | Status: AC
  Administered 2020-01-20: 30 mg via INTRAVENOUS
  Filled 2020-01-20: qty 1

## 2020-01-20 MED ORDER — ONDANSETRON 4 MG PO TBDP
ORAL_TABLET | ORAL | 0 refills | Status: DC
Start: 2020-01-20 — End: 2023-05-01

## 2020-01-20 NOTE — ED Provider Notes (Signed)
[facility] EMERGENCY DEPARTMENT Provider Note   CSN: [id number] Arrival date & time: 01/20/20  1428     History Chief Complaint  Patient presents with  . Covid Exposure  . Fever    Nicholas Bradley is a 44 y.o. male.  Nicholas Bradley is a 44 y.o. male who is otherwise healthy, presents to the ED for evaluation of 5 days of fever, cough, congestion, fatigue and shortness of breath.  Symptoms began on Friday, patient denies any known sick contacts, has not been vaccinated for Covid.  He states that he has had a productive persistent cough, has some shortness of breath, reports chest pain only with coughing.  Reports general fatigue and body aches as well as some headaches.  Has had nausea but no other GI symptoms.  Previous smoking history but is not a current smoker, denies history of chronic lung disease.  He reports that he last had Motrin for fever about 6 hours ago, no other meds prior to arrival for symptoms.        Past Medical History:  Diagnosis Date  . Left eye pain     There are no problems to display for this patient.   Past Surgical History:  Procedure Laterality Date  . HERNIA REPAIR         No family history on file.  Social History   Tobacco Use  . Smoking status: Former Smoker    Quit date: 11/14/2014    Years since quitting: 5.1  . Smokeless tobacco: Never Used  Vaping Use  . Vaping Use: Never assessed  Substance Use Topics  . Alcohol use: No    Comment: quit 2013  . Drug use: No    Home Medications Prior to Admission medications   Medication Sig Start Date End Date Taking? Authorizing Provider  ondansetron (ZOFRAN ODT) 4 MG disintegrating tablet 4mg  ODT q4 hours prn nausea/vomit 01/20/20   01/22/20, PA-C    Allergies    Patient has no known allergies.  Review of Systems   Review of Systems  Constitutional: Positive for chills and fever.  HENT: Positive for congestion and rhinorrhea.   Respiratory: Positive for cough and  shortness of breath.   Cardiovascular: Negative for chest pain.  Gastrointestinal: Positive for nausea. Negative for abdominal pain, diarrhea and vomiting.  Musculoskeletal: Positive for myalgias.  Neurological: Positive for headaches. Negative for dizziness, syncope and light-headedness.  All other systems reviewed and are negative.   Physical Exam Updated Vital Signs BP (!) 141/85 (BP Location: Right Arm)   Pulse 91   Temp (!) 102.7 F (39.3 C) (Oral)   Resp 20   Ht 5\' 11"  (1.803 m)   Wt 136.1 kg   SpO2 94%   BMI 41.84 kg/m   Physical Exam Vitals and nursing note reviewed.  Constitutional:      General: He is not in acute distress.    Appearance: Normal appearance. He is well-developed. He is obese. He is not ill-appearing or diaphoretic.     Comments: Well-appearing and in no distress  HENT:     Head: Normocephalic and atraumatic.     Nose: Rhinorrhea present.     Mouth/Throat:     Mouth: Mucous membranes are moist.     Pharynx: Oropharynx is clear.  Eyes:     General:        Right eye: No discharge.        Left eye: No discharge.  Neck:     Comments: No  rigidity Cardiovascular:     Rate and Rhythm: Normal rate and regular rhythm.     Pulses: Normal pulses.     Heart sounds: Normal heart sounds. No murmur heard.  No gallop.   Pulmonary:     Effort: Pulmonary effort is normal. No respiratory distress.     Breath sounds: Normal breath sounds.     Comments: Patient is mildly tachypneic but respirations are equal and unlabored, patient has some decreased air movement bilaterally but no focal wheezes, rales or rhonchi, coughing frequently during exam, maintaining O2 saturations greater than 96% on room air Abdominal:     General: Bowel sounds are normal. There is no distension.     Palpations: Abdomen is soft. There is no mass.     Tenderness: There is no abdominal tenderness. There is no guarding.     Comments: Abdomen soft, nondistended, nontender to palpation in  all quadrants without guarding or peritoneal signs  Musculoskeletal:        General: No deformity.     Cervical back: Neck supple.  Lymphadenopathy:     Cervical: No cervical adenopathy.  Skin:    General: Skin is warm and dry.     Capillary Refill: Capillary refill takes less than 2 seconds.  Neurological:     Mental Status: He is alert and oriented to person, place, and time.  Psychiatric:        Mood and Affect: Mood normal.        Behavior: Behavior normal.     ED Results / Procedures / Treatments   Labs (all labs ordered are listed, but only abnormal results are displayed) Labs Reviewed  SARS CORONAVIRUS 2 BY RT PCR (HOSPITAL ORDER, PERFORMED IN [facility]) - Abnormal; Notable for the following components:      Result Value   SARS Coronavirus 2 POSITIVE (*)    All other components within normal limits    EKG None  Radiology DG Chest Port 1 View  Result Date: 01/20/2020 CLINICAL DATA:  44 year old male with chest pain and shortness of breath. EXAM: PORTABLE CHEST 1 VIEW COMPARISON:  None. FINDINGS: Shallow inspiration. No focal consolidation, pleural effusion or pneumothorax. Chest top-normal cardiac size. No acute osseous pathology. IMPRESSION: No active disease. Electronically Signed   By: Elgie Collard M.D.   On: 01/20/2020 17:41    Procedures Procedures (including critical care time)  Medications Ordered in ED Medications  acetaminophen (TYLENOL) tablet 650 mg (650 mg Oral Given 01/20/20 1626)  albuterol (VENTOLIN HFA) 108 (90 Base) MCG/ACT inhaler 4 puff (4 puffs Inhalation Given 01/20/20 1740)  aerochamber plus with mask device 1 each (1 each Other Given 01/20/20 1740)  ketorolac (TORADOL) 30 MG/ML injection 30 mg (30 mg Intravenous Given 01/20/20 1743)  sodium chloride 0.9 % bolus 1,000 mL (0 mLs Intravenous Stopped 01/20/20 1918)    ED Course  I have reviewed the triage vital signs and the nursing notes.  Pertinent labs & imaging results that  were available during my care of the patient were reviewed by me and considered in my medical decision making (see chart for details).    MDM Rules/Calculators/A&P                          44 year old male presents with fevers, cough, congestion, fatigue and shortness of breath for the past 5 days, he denies known Covid exposure, but is unvaccinated.  On arrival he is febrile at 102.6, on exam  he is tachypneic but satting well on room air with clear lungs, and is overall well-appearing.  Reports nausea but no other GI symptoms.  Will get Covid tested chest x-ray, given reassuring vitals, fever improving with treatment, no oxygen requirement, do not think patient needs lab work at this time.  No associated chest pain.  Will give IV fluids, additional fever treatment, and albuterol to help with tachypnea.  Covid test is positive, chest x-ray is clear without evidence of Covid pneumonia.  Fever has resolved and tachypnea has improved significantly with use of bronchodilator.  Patient ambulated in the department and maintained O2 sats of 96%.  Discussed symptomatic treatment of Covid infection as well as strict return precautions and urged to purchase home pulse ox.  Referral made to monoclonal antibody infusion clinic and patient given information for post Covid follow-up care.  He expresses understanding and agreement.  Discharged home in good condition.  Nicholas Bradley was evaluated in Emergency Department on 01/21/2020 for the symptoms described in the history of present illness. He was evaluated in the context of the global COVID-19 pandemic, which necessitated consideration that the patient might be at risk for infection with the SARS-CoV-2 virus that causes COVID-19. Institutional protocols and algorithms that pertain to the evaluation of patients at risk for COVID-19 are in a state of rapid change based on information released by regulatory bodies including the CDC and federal and state organizations.  These policies and algorithms were followed during the patient's care in the ED.  Final Clinical Impression(s) / ED Diagnoses Final diagnoses:  COVID-19 virus infection    Rx / DC Orders ED Discharge Orders         Ordered    ondansetron (ZOFRAN ODT) 4 MG disintegrating tablet     Discontinue  Reprint     01/20/20 1956           Dartha Lodge, [place] 01/21/20 1545    Derwood Kaplan, MD 01/28/20 1827

## 2020-01-20 NOTE — Discharge Instructions (Addendum)
You have tested positive for COVID-19 virus.  Please continue to quarantine at home and monitor your symptoms closely. You chest x-ray was clear. Antibiotics are not helpful in treating viral infection, the virus should run its course in about 10-14 days. Please make sure you are drinking plenty of fluids. You can treat your symptoms supportively with Tylenol 1000 mg every 6 hours and ibuprofen 600 mg every 6 hours for fevers and pains, and over the counter cough syrups and throat lozenges to help with cough.  Use inhaler as needed for shortness of breath.  Use prescribed Zofran as needed for nausea.  If your symptoms are not improving please follow up with you Primary doctor.   I recommend that you purchase a home pulse ox to help better monitor your oxygen at home, if you start to have increased work of breathing or shortness of breath or your oxygen drops below 92% please immediately return to the hospital for reevaluation.  If you develop persistent fevers, shortness of breath or difficulty breathing, chest pain, severe headache and neck pain, persistent nausea and vomiting or other new or concerning symptoms return to the Emergency department.

## 2020-01-20 NOTE — ED Triage Notes (Signed)
Possible covid. Fever, body aches

## 2020-01-20 NOTE — ED Notes (Addendum)
CRITICAL VALUE ALERT  Critical Value:  COVID POSITIVE   Date & Time Notied: 01/20/2020 1917  Provider Notified: EDP PA FORD  Orders Received/Actions taken: Continue Precautions

## 2020-01-21 ENCOUNTER — Telehealth: Payer: Self-pay | Admitting: Adult Health

## 2020-01-21 NOTE — Telephone Encounter (Signed)
Called patient to discuss monoclonal antibody treatment given to those at risk for complications and or hospitalization from the virus.  Patient is at risk due to his BMI of 41.  We discussed therapy.  He declines at this time due to cost, and not having insurance.  Gave him our call back number if he worsens.    Lillard Anes, NP

## 2020-01-24 ENCOUNTER — Telehealth: Payer: Self-pay | Admitting: Nurse Practitioner

## 2020-01-24 NOTE — Telephone Encounter (Signed)
Spoke to patient about setting up an appointment at [facility]. Patient denied appointment at this time.

## 2020-04-05 IMAGING — CT CT ABD-PELV W/ CM
2 of 5 series · 16 of 46 positions shown, 18 images · IV contrast (iopamidol)
Comparison: None.

CLINICAL DATA: Abdomen pain. History of hernia repair.

EXAM:
CT ABDOMEN AND PELVIS WITH CONTRAST
TECHNIQUE: Multidetector CT imaging of the abdomen and pelvis was performed
using the standard protocol following bolus administration of
intravenous contrast.
CONTRAST:  100mL [id number] IOPAMIDOL ([id number]) INJECTION 61%

[Series 2: axial st · axial · 0.83mm/px · z∈[-848,-408]mm · 13 of 100 slices shown, 15 images]
[im 6/100  soft-tissue]
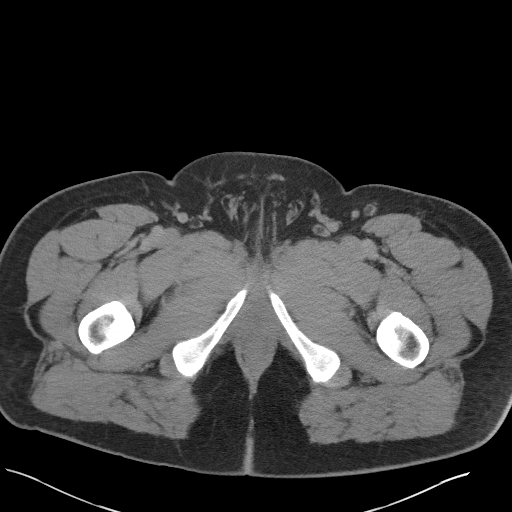
[im 6/100  bone]
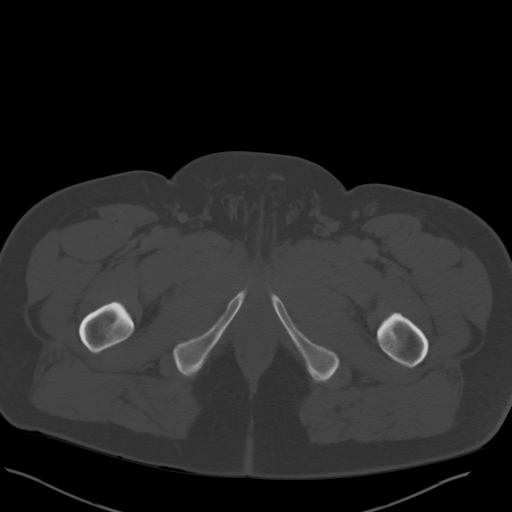
[im 12/100  soft-tissue]
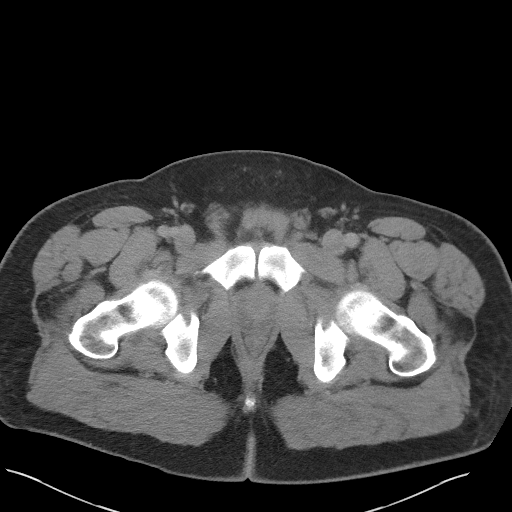
[im 23/100  soft-tissue]
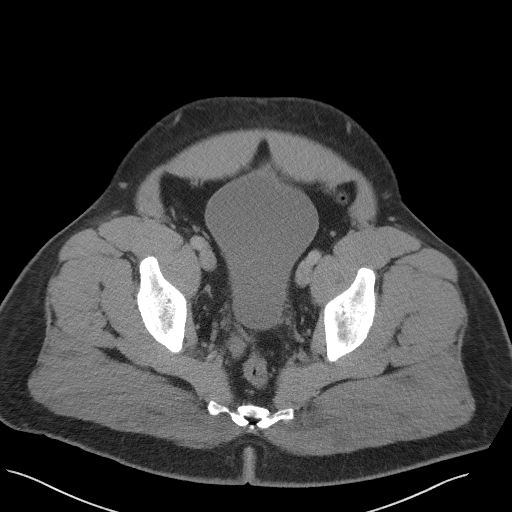
[im 28/100  soft-tissue]
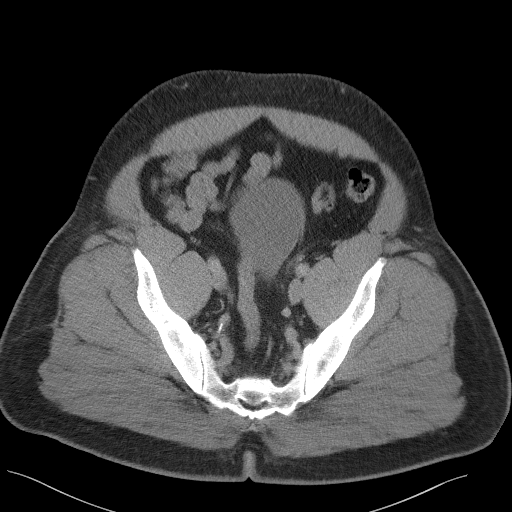
[im 34/100  soft-tissue]
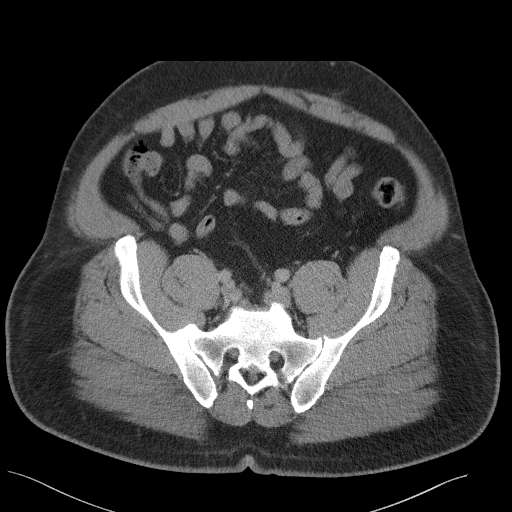
[im 45/100  soft-tissue]
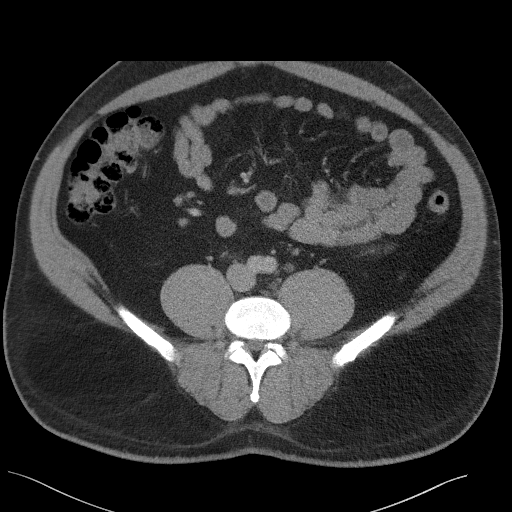
[im 50/100  soft-tissue]
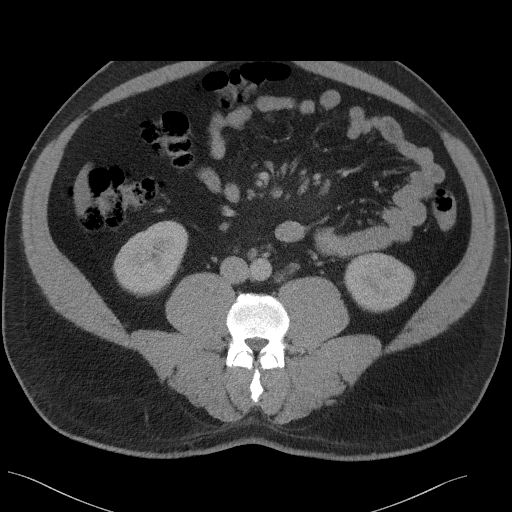
[im 56/100  soft-tissue]
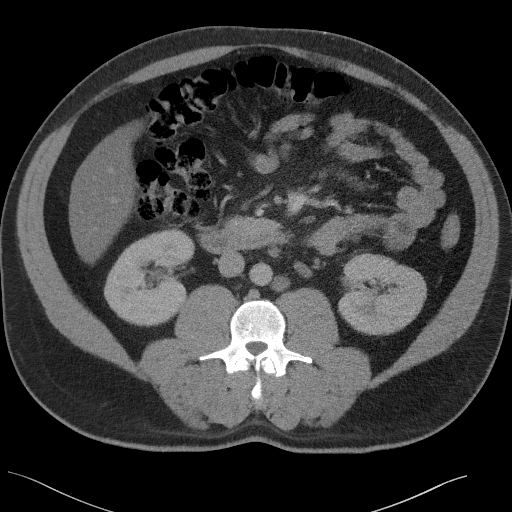
[im 67/100  soft-tissue]
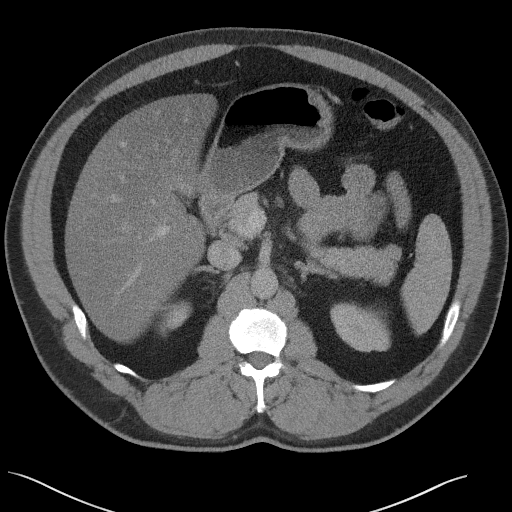
[im 67/100  bone]
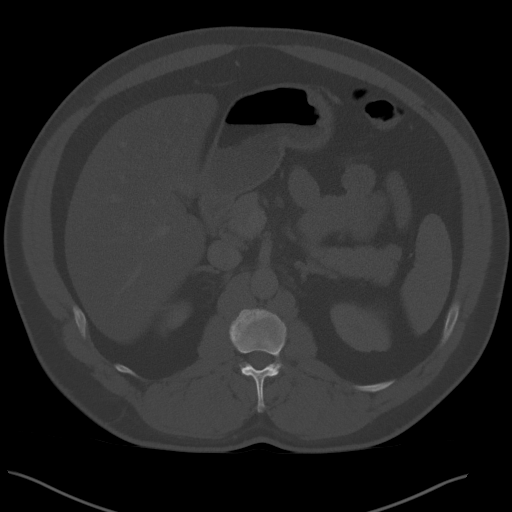
[im 72/100  soft-tissue]
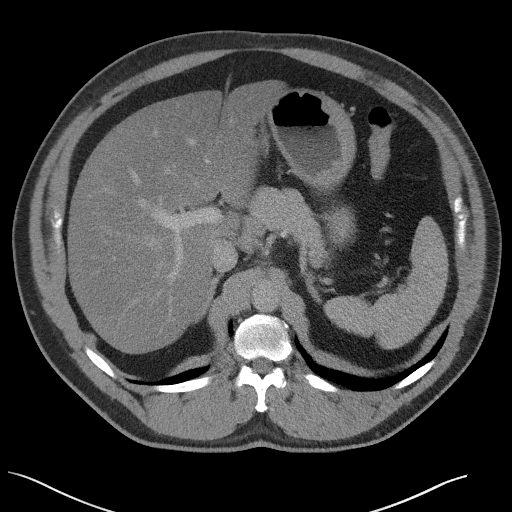
[im 78/100  soft-tissue]
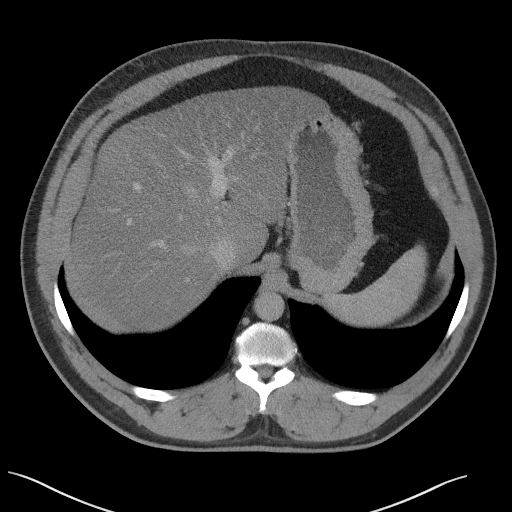
[im 89/100  soft-tissue]
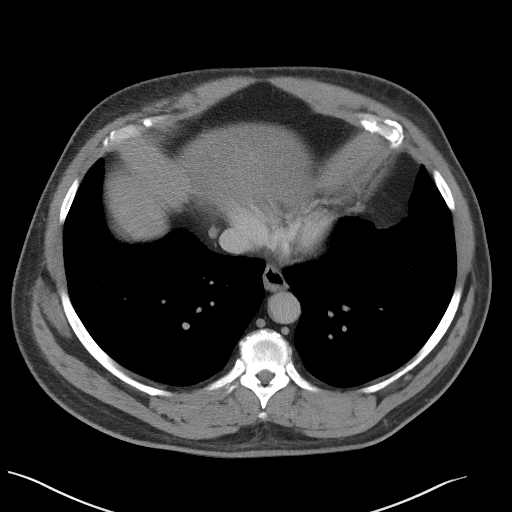
[im 94/100  soft-tissue]
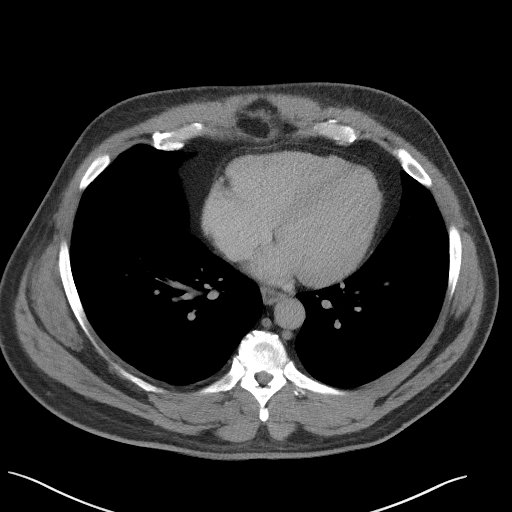

[Series 5: coronal st · coronal · 0.83mm/px · 3 of 122 slices shown]
[im 41/122  soft-tissue]
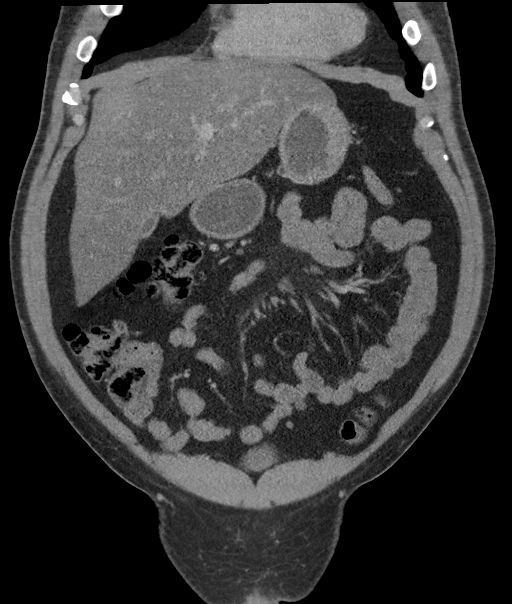
[im 54/122  soft-tissue]
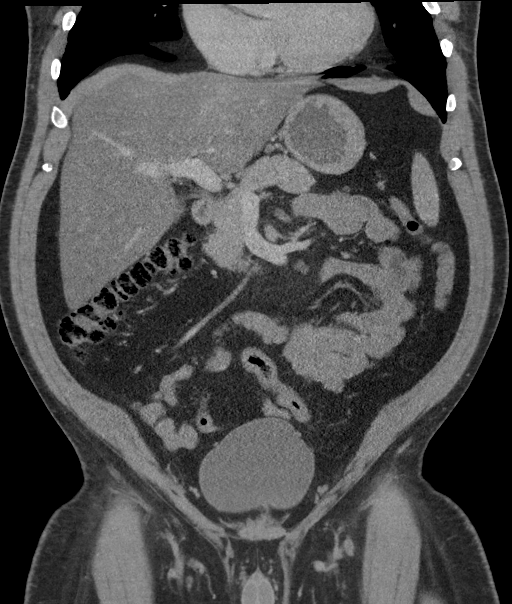
[im 68/122  soft-tissue]
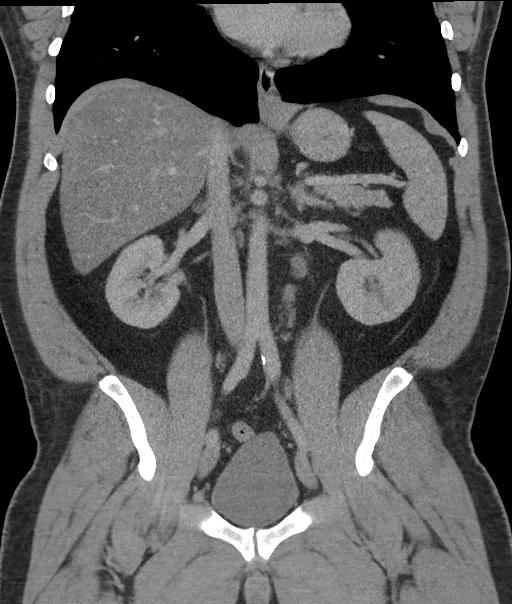

[16 of 46 positions shown; findings below may reference images not displayed]

FINDINGS: Lower chest: No acute abnormality.

Hepatobiliary: Steatosis of the liver without space-occupying mass.
Contracted gallbladder without stones. No biliary dilatation.

Pancreas: Unremarkable. No pancreatic ductal dilatation or
surrounding inflammatory changes.

Spleen: Normal in size without focal abnormality.

Adrenals/Urinary Tract: Adrenal glands are unremarkable. Kidneys are
normal, without renal calculi, focal lesion, or hydronephrosis.
Bladder is unremarkable.

Stomach/Bowel: Stomach is within normal limits. Appendix appears
normal. No evidence of bowel wall thickening, distention, or
inflammatory changes.

Vascular/Lymphatic: Faint hazy appearance of the mesenteric fat at
the root of the mesentery with small mesenteric lymph nodes.
Findings may be secondary to sclerosing mesenteritis. Nonaneurysmal
thoracic aorta. Minimal atherosclerosis of the common iliac
arteries.

Reproductive: The prostate is mildly enlarged measuring
approximately 5.3 x 4.1 x 5 cm and heterogeneous in appearance, more
hypoechoic along the right peripheral zone. Asymmetric enlargement
of what appears to be the right seminal vesicle relative to left,
series [DATE]. Clinical correlation is recommended.

Other: No abdominal wall hernia or abnormality. No apparent
recurrence of hernia. No abdominopelvic ascites.

Musculoskeletal: No acute or significant osseous findings. Mild soft
tissue induration along the lateral aspect both hips.
IMPRESSION: 1. Hepatic steatosis.
2. Hazy appearance of the mesenteric fat at the root of the
mesentery with small mesenteric lymph nodes. Findings may be seen in
the setting of sclerosing mesenteritis.
3. Enlargement of the prostate with asymmetric enlargement of what
appears to be the right seminal vesicle relative to left. Clinical
correlation is recommended.

## 2022-01-07 IMAGING — DX DG CHEST 1V PORT
1 series · 1 of 1 positions shown · non-contrast
Comparison: None.

CLINICAL DATA: 44-year-old male with chest pain and shortness of
breath.

EXAM:
PORTABLE CHEST 1 VIEW

[chest ap]
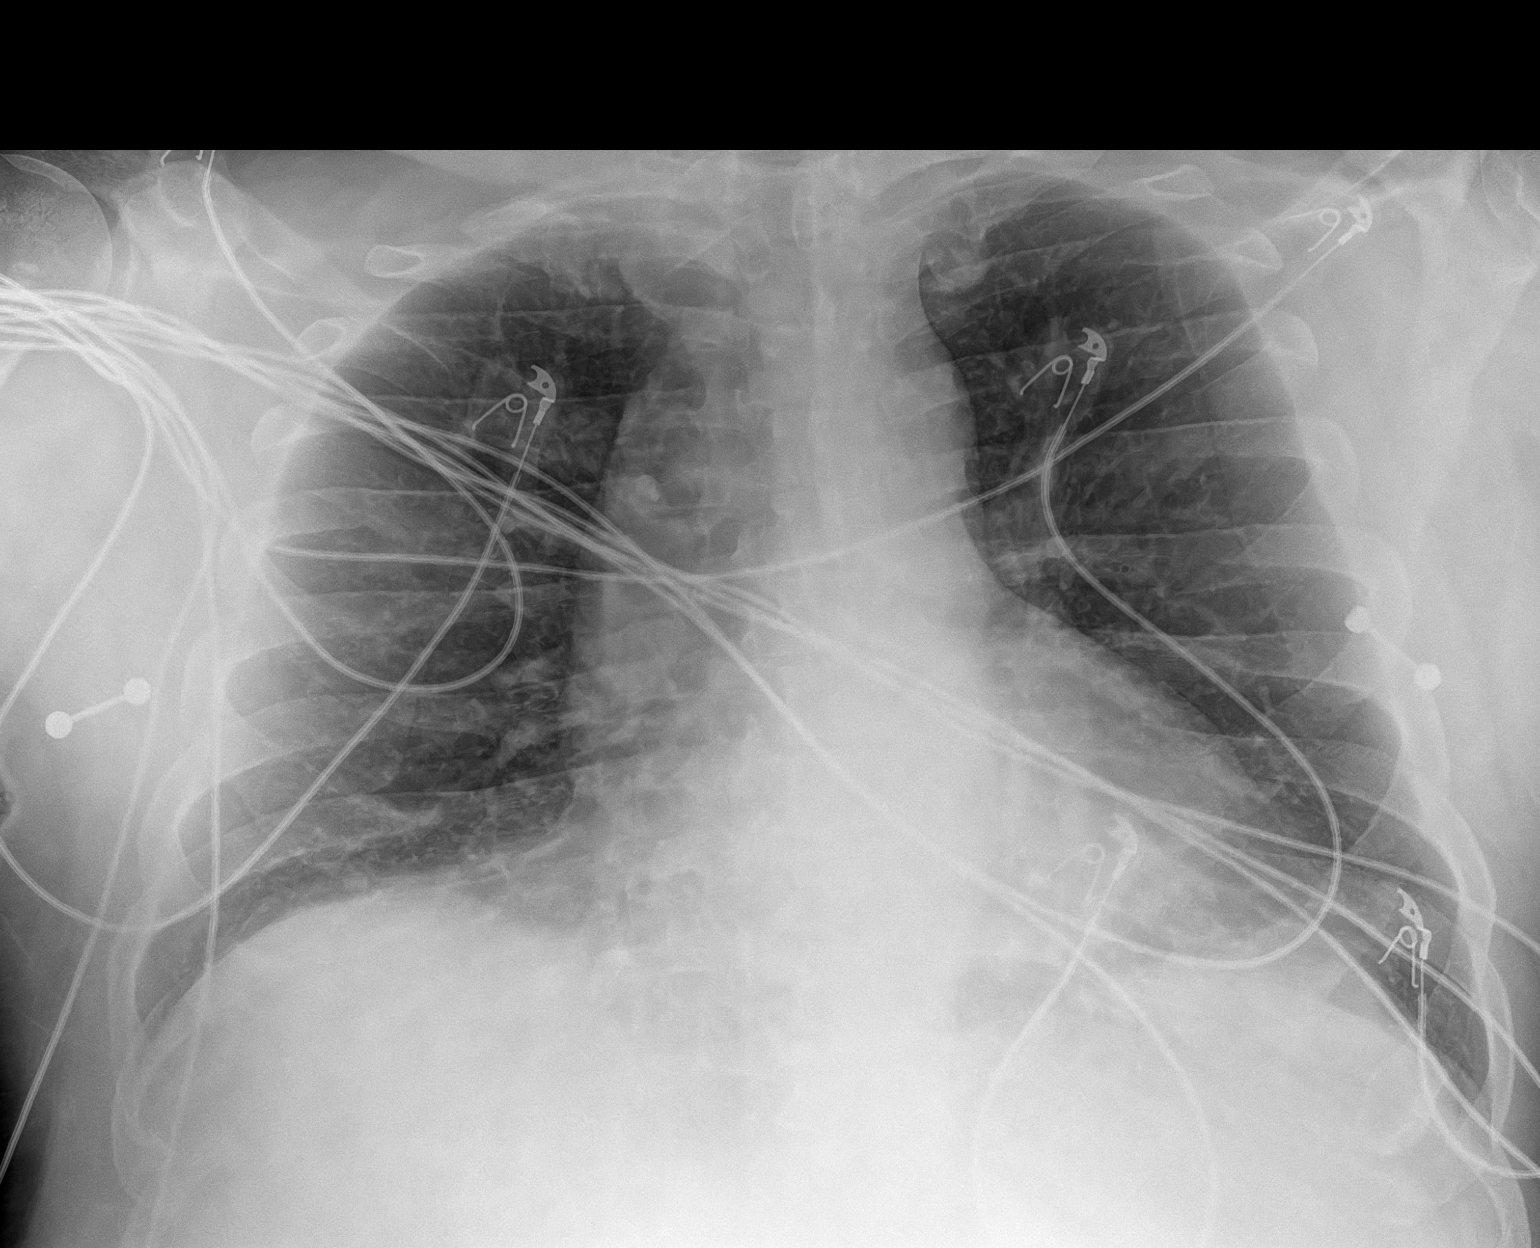

[1 of 1 positions shown; findings below may reference images not displayed]

FINDINGS: Shallow inspiration. No focal consolidation, pleural effusion or
pneumothorax. Chest top-normal cardiac size. No acute osseous
pathology.
IMPRESSION: No active disease.

## 2022-03-24 NOTE — Progress Notes (Signed)
Sleep Medicine   Office Visit  Patient Name: Nicholas Bradley DOB: 1976/01/11 MRN [id number]    Chief Complaint: sleep evaluation  Brief History:  Verlie presents for an initial evaluation for possible sleep apnea.  He reports his sleep quality is very poor due to waking tired every day. This is noted most nights. This has been going on for at least 15 years.The patient's bed partner reports  loud snoring, gasping with witnessed apnea at night. The patient relates the following symptoms: snoring that wakes him, waking tired, morning headaches, brain fog, memory issues, a lack of focus and energy are also present. The patient goes to sleep at 9:30-10pm and wakes up at 4-5am.  he  Sleep quality is the same when outside home environment.  Patient has noted restlessness of his legs at night.  The patient  relates no unusual behavior during the night.  The patient denies a history of psychiatric problems. The Epworth Sleepiness Score is 9 out of 24 .  Stop Bang score of 7. The patient relates  Cardiovascular risk factors include: hypertension and history of enlarged muscle.     ROS  General: (-) fever, (-) chills, (-) night sweat Nose and Sinuses: (-) nasal stuffiness or itchiness, (-) postnasal drip, (-) nosebleeds, (-) sinus trouble. Mouth and Throat: (-) sore throat, (-) hoarseness. Neck: (-) swollen glands, (-) enlarged thyroid, (-) neck pain. Respiratory: - cough, +shortness of breath, - wheezing. Neurologic: - numbness, - tingling. Psychiatric: - anxiety, - depression Sleep behavior: -sleep paralysis -hypnogogic hallucinations -dream enactment      -vivid dreams -cataplexy -night terrors -sleep walking   Current Medication: Outpatient Encounter Medications as of 03/28/2022  Medication Sig   atorvastatin (LIPITOR) 20 MG tablet Take by mouth.   glimepiride (AMARYL) 4 MG tablet Take 4 mg by mouth daily with breakfast.   insulin glargine (LANTUS) 100 UNIT/ML injection Inject into the  skin daily.   losartan (COZAAR) 100 MG tablet Take 100 mg by mouth daily.   ondansetron (ZOFRAN ODT) 4 MG disintegrating tablet 4mg  ODT q4 hours prn nausea/vomit   No facility-administered encounter medications on file as of 03/28/2022.    Surgical History: Past Surgical History:  Procedure Laterality Date   HERNIA REPAIR      Medical History: Past Medical History:  Diagnosis Date   Left eye pain     Family History: Non contributory to the present illness  Social History: Social History   Socioeconomic History   Marital status: Significant Other    Spouse name: Not on file   Number of children: Not on file   Years of education: Not on file   Highest education level: Not on file  Occupational History   Not on file  Tobacco Use   Smoking status: Former    Types: Cigarettes    Quit date: 11/14/2014    Years since quitting: 7.3   Smokeless tobacco: Never  Vaping Use   Vaping Use: Not on file  Substance and Sexual Activity   Alcohol use: No    Comment: quit 2013   Drug use: No   Sexual activity: Not on file  Other Topics Concern   Not on file  Social History Narrative   Not on file   Social Determinants of Health   Financial Resource Strain: Not on file  Food Insecurity: Not on file  Transportation Needs: Not on file  Physical Activity: Not on file  Stress: Not on file  Social Connections: Not on file  Intimate  Partner Violence: Not on file    Vital Signs: Blood pressure (!) 147/94, pulse 72, resp. rate 16, height 5\' 9"  (1.753 m), weight (!) 303 lb 14.4 oz (137.8 kg), SpO2 96 %. Body mass index is 44.88 kg/m.   Examination: General Appearance: The patient is well-developed, well-nourished, and in no distress. Neck Circumference: 50.5cm Skin: Gross inspection of skin unremarkable. Head: normocephalic, no gross deformities. Eyes: no gross deformities noted. ENT: ears appear grossly normal Neurologic: Alert and oriented. No involuntary  movements.    STOP BANG RISK ASSESSMENT S (snore) Have you been told that you snore?     YES   T (tired) Are you often tired, fatigued, or sleepy during the day?   YES  O (obstruction) Do you stop breathing, choke, or gasp during sleep? YES   P (pressure) Do you have or are you being treated for high blood pressure? YES   B (BMI) Is your body index greater than 35 kg/m? YES   A (age) Are you 21 years old or older? NO   N (neck) Do you have a neck circumference greater than 16 inches?   YES   G (gender) Are you a male? YES   TOTAL STOP/BANG "YES" ANSWERS 7                                                               A STOP-Bang score of 2 or less is considered low risk, and a score of 5 or more is high risk for having either moderate or severe OSA. For people who score 3 or 4, doctors may need to perform further assessment to determine how likely they are to have OSA.         EPWORTH SLEEPINESS SCALE:  Scale:  (0)= no chance of dozing; (1)= slight chance of dozing; (2)= moderate chance of dozing; (3)= high chance of dozing  Chance  Situtation    Sitting and reading: 1    Watching TV: 3    Sitting Inactive in public: 2    As a passenger in car: 0      Lying down to rest: 3    Sitting and talking: 0    Sitting quielty after lunch: 0    In a car, stopped in traffic: 0   TOTAL SCORE:   9 out of 24    SLEEP STUDIES:  None on file   LABS: No results found for this or any previous visit (from the past 2160 hour(s)).  Radiology: [facility] Chest Port 1 View  Result Date: 01/20/2020 CLINICAL DATA:  46 year old male with chest pain and shortness of breath. EXAM: PORTABLE CHEST 1 VIEW COMPARISON:  None. FINDINGS: Shallow inspiration. No focal consolidation, pleural effusion or pneumothorax. Chest top-normal cardiac size. No acute osseous pathology. IMPRESSION: No active disease. Electronically Signed   By: Anner Crete M.D.   On: 01/20/2020 17:41    No results  found.  No results found.    Assessment and Plan: Patient Active Problem List   Diagnosis Date Noted   OSA (obstructive sleep apnea) 03/28/2022   Hypertension associated with diabetes ([place]) 03/28/2022   Morbid obesity ([place]) 03/28/2022   1. OSA (obstructive sleep apnea) PLAN OSA:   Patient evaluation suggests high risk of sleep disordered breathing due  to observed apnea, snoring, choking, morning headaches, morbid obesity, brain fog, daytime somnolence.  Patient has comorbid cardiovascular risk factors including: hypertension which could be exacerbated by pathologic sleep-disordered breathing.  Suggest: PSG to assess the patient's sleep disordered breathing. The patient was also counselled on weight loss to optimize sleep health.  2. Hypertension associated with diabetes (HCC) Hypertension Counseling:   The following hypertensive lifestyle modification were recommended and discussed:  1. Limiting alcohol intake to less than 1 oz/day of ethanol:(24 oz of beer or 8 oz of wine or 2 oz of 100-proof whiskey). 2. Take baby ASA 81 mg daily. 3. Importance of regular aerobic exercise and losing weight. 4. Reduce dietary saturated fat and cholesterol intake for overall cardiovascular health. 5. Maintaining adequate dietary potassium, calcium, and magnesium intake. 6. Regular monitoring of the blood pressure. 7. Reduce sodium intake to less than 100 mmol/day (less than 2.3 gm of sodium or less than 6 gm of sodium choride)    3. Morbid obesity (HCC) Obesity Counseling: Had a lengthy discussion regarding patients BMI and weight issues. Patient was instructed on portion control as well as increased activity. Also discussed caloric restrictions with trying to maintain intake less than 2000 Kcal. Discussions were made in accordance with the 5As of weight management. Simple actions such as not eating late and if able to, taking a walk is suggested.      General Counseling: I have discussed the  findings of the evaluation and examination with Lekendrick.  I have also discussed any further diagnostic evaluation thatmay be needed or ordered today. Yani verbalizes understanding of the findings of todays visit. We also reviewed his medications today and discussed drug interactions and side effects including but not limited excessive drowsiness and altered mental states. We also discussed that there is always a risk not just to him but also people around him. he has been encouraged to call the office with any questions or concerns that should arise related to todays visit.  No orders of the defined types were placed in this encounter.       I have personally obtained a history, evaluated the patient, evaluated pertinent data, formulated the assessment and plan and placed orders.  This patient was seen today by Emmaline Kluver, PA-C in collaboration with Dr. Freda Munro.    Yevonne Pax, MD [facility] Diplomate ABMS Pulmonary and Critical Care Medicine Sleep medicine

## 2022-03-28 ENCOUNTER — Ambulatory Visit (INDEPENDENT_AMBULATORY_CARE_PROVIDER_SITE_OTHER): Payer: Medicaid Other | Admitting: Internal Medicine

## 2022-03-28 VITALS — BP 147/94 | HR 72 | Resp 16 | Ht 69.0 in | Wt 303.9 lb

## 2022-03-28 DIAGNOSIS — E1159 Type 2 diabetes mellitus with other circulatory complications: Secondary | ICD-10-CM

## 2022-03-28 DIAGNOSIS — I152 Hypertension secondary to endocrine disorders: Secondary | ICD-10-CM | POA: Diagnosis not present

## 2022-03-28 DIAGNOSIS — G4733 Obstructive sleep apnea (adult) (pediatric): Secondary | ICD-10-CM

## 2022-03-28 DIAGNOSIS — I10 Essential (primary) hypertension: Secondary | ICD-10-CM | POA: Insufficient documentation

## 2022-06-06 HISTORY — PX: FINGER NEUROPLASTY: SHX1639

## 2022-10-24 ENCOUNTER — Ambulatory Visit (INDEPENDENT_AMBULATORY_CARE_PROVIDER_SITE_OTHER): Payer: Medicaid Other | Admitting: Internal Medicine

## 2022-10-24 VITALS — BP 148/88 | HR 76 | Resp 16 | Ht 70.0 in | Wt 310.0 lb

## 2022-10-24 DIAGNOSIS — Z7189 Other specified counseling: Secondary | ICD-10-CM | POA: Diagnosis not present

## 2022-10-24 DIAGNOSIS — G4733 Obstructive sleep apnea (adult) (pediatric): Secondary | ICD-10-CM

## 2022-10-24 DIAGNOSIS — E1159 Type 2 diabetes mellitus with other circulatory complications: Secondary | ICD-10-CM | POA: Diagnosis not present

## 2022-10-24 DIAGNOSIS — I152 Hypertension secondary to endocrine disorders: Secondary | ICD-10-CM

## 2022-10-24 NOTE — Progress Notes (Unsigned)
[facility] [address] [place], [place] [address]  Pulmonary Sleep Medicine   Office Visit Note  Patient Name: Nicholas Bradley DOB: May 16, 1976 MRN [id number]    Chief Complaint: Obstructive Sleep Apnea visit  Brief History:  Onis is seen today for a 90 day follow up after setup on CPAP at 13 cmh20. The patient has a 6 month history of sleep apnea. Patient is using PAP nightly.  The patient feels rested after sleeping with PAP.  The patient reports benefiting from PAP use. Reported sleepiness is  improved and the Epworth Sleepiness Score is 1 out of 24. The patient does not take naps. The patient complains of the following: He occasionally takes the mask off unintentionally.  The compliance download shows 83% compliance with an average use time of 6:03 hours. The AHI is 0.8.  The patient *** of limb movements disrupting sleep.  ROS  General: (-) fever, (-) chills, (-) night sweat Nose and Sinuses: (-) nasal stuffiness or itchiness, (-) postnasal drip, (-) nosebleeds, (-) sinus trouble. Mouth and Throat: (-) sore throat, (-) hoarseness. Neck: (-) swollen glands, (-) enlarged thyroid, (-) neck pain. Respiratory: *** cough, *** shortness of breath, *** wheezing. Neurologic: *** numbness, *** tingling. Psychiatric: *** anxiety, *** depression   Current Medication: Outpatient Encounter Medications as of 10/24/2022  Medication Sig   atorvastatin (LIPITOR) 40 MG tablet Take 40 mg by mouth at bedtime.   glimepiride (AMARYL) 4 MG tablet Take 4 mg by mouth daily with breakfast.   losartan (COZAAR) 100 MG tablet Take 100 mg by mouth daily.   ondansetron (ZOFRAN ODT) 4 MG disintegrating tablet 4mg  ODT q4 hours prn nausea/vomit   terbinafine (LAMISIL) 250 MG tablet Take 250 mg by mouth daily.   [DISCONTINUED] atorvastatin (LIPITOR) 20 MG tablet Take by mouth.   [DISCONTINUED] insulin glargine (LANTUS) 100 UNIT/ML injection Inject into the skin daily.   No  facility-administered encounter medications on file as of 10/24/2022.    Surgical History: Past Surgical History:  Procedure Laterality Date   HERNIA REPAIR      Medical History: Past Medical History:  Diagnosis Date   Left eye pain     Family History: Non contributory to the present illness  Social History: Social History   Socioeconomic History   Marital status: Significant Other    Spouse name: Not on file   Number of children: Not on file   Years of education: Not on file   Highest education level: Not on file  Occupational History   Not on file  Tobacco Use   Smoking status: Former    Types: Cigarettes    Quit date: 11/14/2014    Years since quitting: 7.9   Smokeless tobacco: Never  Vaping Use   Vaping Use: Not on file  Substance and Sexual Activity   Alcohol use: No    Comment: quit 2013   Drug use: No   Sexual activity: Not on file  Other Topics Concern   Not on file  Social History Narrative   Not on file   Social Determinants of Health   Financial Resource Strain: Not on file  Food Insecurity: Not on file  Transportation Needs: Not on file  Physical Activity: Not on file  Stress: Not on file  Social Connections: Not on file  Intimate Partner Violence: Not on file    Vital Signs: Blood pressure (!) 148/88, pulse 76, resp. rate 16, height 5\' 10"  (1.778 m), weight (!) 310 lb (140.6 kg), SpO2  97 %. Body mass index is 44.48 kg/m.    Examination: General Appearance: The patient is well-developed, well-nourished, and in no distress. Neck Circumference: 53 cm Skin: Gross inspection of skin unremarkable. Head: normocephalic, no gross deformities. Eyes: no gross deformities noted. ENT: ears appear grossly normal Neurologic: Alert and oriented. No involuntary movements.  STOP BANG RISK ASSESSMENT S (snore) Have you been told that you snore?     NO   T (tired) Are you often tired, fatigued, or sleepy during the day?   NO  O (obstruction) Do you  stop breathing, choke, or gasp during sleep? NO   P (pressure) Do you have or are you being treated for high blood pressure? YES   B (BMI) Is your body index greater than 35 kg/m? YES   A (age) Are you 33 years old or older? NO   N (neck) Do you have a neck circumference greater than 16 inches?   YES   G (gender) Are you a male? YES   TOTAL STOP/BANG "YES" ANSWERS 4       A STOP-Bang score of 2 or less is considered low risk, and a score of 5 or more is high risk for having either moderate or severe OSA. For people who score 3 or 4, doctors may need to perform further assessment to determine how likely they are to have OSA.         EPWORTH SLEEPINESS SCALE:  Scale:  (0)= no chance of dozing; (1)= slight chance of dozing; (2)= moderate chance of dozing; (3)= high chance of dozing  Chance  Situtation    Sitting and reading: 0    Watching TV: 0    Sitting Inactive in public: 0    As a passenger in car: 0      Lying down to rest: 1    Sitting and talking: 0    Sitting quielty after lunch: 0    In a car, stopped in traffic: 0   TOTAL SCORE:   1 out of 24    SLEEP STUDIES:  PSG (05/10/22) AHI 18, REM AHI 42, min SPO2 85%   CPAP COMPLIANCE DATA:  Date Range: 07/20/22 - 08/18/22  Average Daily Use: 6:03 hours  Median Use: 6:40 hours  Compliance for > 4 Hours: 25 days  AHI: 0.8 respiratory events per hour  Days Used: 30/30  Mask Leak: 21.0  95th Percentile Pressure: 13 cmh20         LABS: No results found for this or any previous visit (from the past 2160 hour(s)).  Radiology: [facility] Chest Port 1 View  Result Date: 01/20/2020 CLINICAL DATA:  46 year old male with chest pain and shortness of breath. EXAM: PORTABLE CHEST 1 VIEW COMPARISON:  None. FINDINGS: Shallow inspiration. No focal consolidation, pleural effusion or pneumothorax. Chest top-normal cardiac size. No acute osseous pathology. IMPRESSION: No active disease. Electronically Signed   By:  Elgie Collard M.D.   On: 01/20/2020 17:41    No results found.  No results found.    Assessment and Plan: Patient Active Problem List   Diagnosis Date Noted   CPAP use counseling 10/24/2022   OSA (obstructive sleep apnea) 03/28/2022   Hypertension associated with diabetes (HCC) 03/28/2022   Morbid obesity (HCC) 03/28/2022   1. OSA (obstructive sleep apnea) The patient does tolerate PAP and reports  benefit from PAP use. The patient was reminded how to clean equipment and advised to replace supplies routinely. The patient was also counselled on weight  loss. The compliance is good. The AHI is 0.7.   OSA on cpap- controlled. Continue withgood compliance with pap. CPAP continues to be medically necessary to treat this patient's OSA. F/u one year.    2. CPAP use counseling CPAP Counseling: had a lengthy discussion with the patient regarding the importance of PAP therapy in management of the sleep apnea. Patient appears to understand the risk factor reduction and also understands the risks associated with untreated sleep apnea. Patient will try to make a good faith effort to remain compliant with therapy. Also instructed the patient on proper cleaning of the device including the water must be changed daily if possible and use of distilled water is preferred. Patient understands that the machine should be regularly cleaned with appropriate recommended cleaning solutions that do not damage the PAP machine for example given white vinegar and water rinses. Other methods such as ozone treatment may not be as good as these simple methods to achieve cleaning.   3. Hypertension associated with diabetes (HCC) Hypertension Counseling:   The following hypertensive lifestyle modification were recommended and discussed:  1. Limiting alcohol intake to less than 1 oz/day of ethanol:(24 oz of beer or 8 oz of wine or 2 oz of 100-proof whiskey). 2. Take baby ASA 81 mg daily. 3. Importance of regular  aerobic exercise and losing weight. 4. Reduce dietary saturated fat and cholesterol intake for overall cardiovascular health. 5. Maintaining adequate dietary potassium, calcium, and magnesium intake. 6. Regular monitoring of the blood pressure. 7. Reduce sodium intake to less than 100 mmol/day (less than 2.3 gm of sodium or less than 6 gm of sodium choride)    4. Morbid obesity (HCC) Obesity Counseling: Had a lengthy discussion regarding patients BMI and weight issues. Patient was instructed on portion control as well as increased activity. Also discussed caloric restrictions with trying to maintain intake less than 2000 Kcal. Discussions were made in accordance with the 5As of weight management. Simple actions such as not eating late and if able to, taking a walk is suggested.      General Counseling: I have discussed the findings of the evaluation and examination with Esco.  I have also discussed any further diagnostic evaluation thatmay be needed or ordered today. Lerry verbalizes understanding of the findings of todays visit. We also reviewed his medications today and discussed drug interactions and side effects including but not limited excessive drowsiness and altered mental states. We also discussed that there is always a risk not just to him but also people around him. he has been encouraged to call the office with any questions or concerns that should arise related to todays visit.  No orders of the defined types were placed in this encounter.       I have personally obtained a history, examined the patient, evaluated laboratory and imaging results, formulated the assessment and plan and placed orders. This patient was seen today by Emmaline Kluver, PA-C in collaboration with Dr. Freda Munro.   Yevonne Pax, MD [facility] Diplomate ABMS Pulmonary Critical Care Medicine and Sleep Medicine

## 2022-10-24 NOTE — Patient Instructions (Signed)

## 2023-04-28 NOTE — Progress Notes (Unsigned)
[facility] [address] [place], [place] [address]  Pulmonary Sleep Medicine   Office Visit Note  Patient Name: Nicholas Bradley DOB: [birth year] MRN [id number]    Chief Complaint: Obstructive Sleep Apnea visit  Brief History:  Nicholas Bradley is seen today for a follow up visit for CPAP@ 13 cmH2O. The patient has a 1 year history of sleep apnea. Patient is using PAP nightly.  The patient feels rested after sleeping with PAP.  The patient reports benefiting from PAP use. Reported sleepiness is  improved and the Epworth Sleepiness Score is 2 out of 24. The patient does not take naps. The patient complains of the following: none.  The compliance download shows 94% compliance with an average use time of 7 hours 3 minutes. The AHI is 0.5.  The patient does not complain of limb movements disrupting sleep. The patient continues to require PAP therapy in order to eliminate sleep apnea.   ROS  General: (-) fever, (-) chills, (-) night sweat Nose and Sinuses: (-) nasal stuffiness or itchiness, (-) postnasal drip, (-) nosebleeds, (-) sinus trouble. Mouth and Throat: (-) sore throat, (-) hoarseness. Neck: (-) swollen glands, (-) enlarged thyroid, (-) neck pain. Respiratory: - cough, - shortness of breath, - wheezing. Neurologic: - numbness, - tingling. Psychiatric: - anxiety, - depression   Current Medication: Outpatient Encounter Medications as of 05/01/2023  Medication Sig   atorvastatin (LIPITOR) 40 MG tablet Take 40 mg by mouth at bedtime.   glimepiride (AMARYL) 4 MG tablet Take 4 mg by mouth daily with breakfast.   losartan (COZAAR) 100 MG tablet Take 100 mg by mouth daily.   ondansetron (ZOFRAN ODT) 4 MG disintegrating tablet 4mg  ODT q4 hours prn nausea/vomit   terbinafine (LAMISIL) 250 MG tablet Take 250 mg by mouth daily.   No facility-administered encounter medications on file as of 05/01/2023.    Surgical History: Past Surgical History:  Procedure Laterality Date    HERNIA REPAIR      Medical History: Past Medical History:  Diagnosis Date   Left eye pain     Family History: Non contributory to the present illness  Social History: Social History   Socioeconomic History   Marital status: Significant Other    Spouse name: Not on file   Number of children: Not on file   Years of education: Not on file   Highest education level: Not on file  Occupational History   Not on file  Tobacco Use   Smoking status: Former    Current packs/day: 0.00    Types: Cigarettes    Quit date: 11/14/2014    Years since quitting: 8.4   Smokeless tobacco: Never  Vaping Use   Vaping status: Not on file  Substance and Sexual Activity   Alcohol use: No    Comment: quit 2013   Drug use: No   Sexual activity: Not on file  Other Topics Concern   Not on file  Social History Narrative   Not on file   Social Determinants of Health   Financial Resource Strain: Not on file  Food Insecurity: Not on file  Transportation Needs: Not on file  Physical Activity: Not on file  Stress: Not on file  Social Connections: Not on file  Intimate Partner Violence: Not on file    Vital Signs: There were no vitals taken for this visit. There is no height or weight on file to calculate BMI.    Examination: General Appearance: The patient is well-developed, well-nourished, and in  no distress. Neck Circumference: 53 cm Skin: Gross inspection of skin unremarkable. Head: normocephalic, no gross deformities. Eyes: no gross deformities noted. ENT: ears appear grossly normal Neurologic: Alert and oriented. No involuntary movements.  STOP BANG RISK ASSESSMENT S (snore) Have you been told that you snore?     NO   T (tired) Are you often tired, fatigued, or sleepy during the day?   NO  O (obstruction) Do you stop breathing, choke, or gasp during sleep? NO   P (pressure) Do you have or are you being treated for high blood pressure? YES   B (BMI) Is your body index  greater than 35 kg/m? YES   A (age) Are you 94 years old or older? NO   N (neck) Do you have a neck circumference greater than 16 inches?   YES   G (gender) Are you a male? YES   TOTAL STOP/BANG "YES" ANSWERS 4       A STOP-Bang score of 2 or less is considered low risk, and a score of 5 or more is high risk for having either moderate or severe OSA. For people who score 3 or 4, doctors may need to perform further assessment to determine how likely they are to have OSA.         EPWORTH SLEEPINESS SCALE:  Scale:  (0)= no chance of dozing; (1)= slight chance of dozing; (2)= moderate chance of dozing; (3)= high chance of dozing  Chance  Situtation    Sitting and reading: 0    Watching TV: 1    Sitting Inactive in public: 0    As a passenger in car: 0      Lying down to rest: 1    Sitting and talking: 0    Sitting quielty after lunch: 0    In a car, stopped in traffic: 0   TOTAL SCORE:   2 out of 24    SLEEP STUDIES:  PSG (05/10/2022) AHI 18/hr, REM AHI 42/hr, min SpO2 85%    CPAP COMPLIANCE DATA:  Date Range: 10/30/2022-04/27/2023  Average Daily Use: 7 hours 3 minutes  Median Use: 7 hours 5 minutes  Compliance for > 4 Hours: 94%  AHI: 0.5 respiratory events per hour  Days Used: 180/180 days  Mask Leak: 21.4  95th Percentile Pressure: 13         LABS: No results found for this or any previous visit (from the past 2160 hour(s)).  Radiology: [facility] Chest Port 1 View  Result Date: 01/20/2020 CLINICAL DATA:  47 year old male with chest pain and shortness of breath. EXAM: PORTABLE CHEST 1 VIEW COMPARISON:  None. FINDINGS: Shallow inspiration. No focal consolidation, pleural effusion or pneumothorax. Chest top-normal cardiac size. No acute osseous pathology. IMPRESSION: No active disease. Electronically Signed   By: Elgie Collard M.D.   On: 01/20/2020 17:41    No results found.  No results found.    Assessment and Plan: Patient Active  Problem List   Diagnosis Date Noted   CPAP use counseling 10/24/2022   OSA (obstructive sleep apnea) 03/28/2022   Hypertension associated with diabetes (HCC) 03/28/2022   Morbid obesity (HCC) 03/28/2022   1. OSA (obstructive sleep apnea) The patient does tolerate PAP and reports  benefit from PAP use. The patient was reminded how to clean equipment and advised to replace supplies routinely. The patient was also counselled on weight loss. The compliance is very good. The AHI is 0.5.   OSA on cpap- controlled. Continue with excellent  compliance with pap. CPAP continues to be medically necessary to treat this patient's OSA. F/u one year.    2. CPAP use counseling CPAP Counseling: had a lengthy discussion with the patient regarding the importance of PAP therapy in management of the sleep apnea. Patient appears to understand the risk factor reduction and also understands the risks associated with untreated sleep apnea. Patient will try to make a good faith effort to remain compliant with therapy. Also instructed the patient on proper cleaning of the device including the water must be changed daily if possible and use of distilled water is preferred. Patient understands that the machine should be regularly cleaned with appropriate recommended cleaning solutions that do not damage the PAP machine for example given white vinegar and water rinses. Other methods such as ozone treatment may not be as good as these simple methods to achieve cleaning.   3. Hypertension associated with diabetes (HCC) Hypertension Counseling:   The following hypertensive lifestyle modification were recommended and discussed:  1. Limiting alcohol intake to less than 1 oz/day of ethanol:(24 oz of beer or 8 oz of wine or 2 oz of 100-proof whiskey). 2. Take baby ASA 81 mg daily. 3. Importance of regular aerobic exercise and losing weight. 4. Reduce dietary saturated fat and cholesterol intake for overall cardiovascular health.  5. Maintaining adequate dietary potassium, calcium, and magnesium intake. 6. Regular monitoring of the blood pressure. 7. Reduce sodium intake to less than 100 mmol/day (less than 2.3 gm of sodium or less than 6 gm of sodium choride)    4. Morbid obesity (HCC) Obesity Counseling: Had a lengthy discussion regarding patients BMI and weight issues. Patient was instructed on portion control as well as increased activity. Also discussed caloric restrictions with trying to maintain intake less than 2000 Kcal. Discussions were made in accordance with the 5As of weight management. Simple actions such as not eating late and if able to, taking a walk is suggested.      General Counseling: I have discussed the findings of the evaluation and examination with Marvin.  I have also discussed any further diagnostic evaluation thatmay be needed or ordered today. Tae verbalizes understanding of the findings of todays visit. We also reviewed his medications today and discussed drug interactions and side effects including but not limited excessive drowsiness and altered mental states. We also discussed that there is always a risk not just to him but also people around him. he has been encouraged to call the office with any questions or concerns that should arise related to todays visit.  No orders of the defined types were placed in this encounter.       I have personally obtained a history, examined the patient, evaluated laboratory and imaging results, formulated the assessment and plan and placed orders. This patient was seen today by Emmaline Kluver, PA-C in collaboration with Dr. Freda Munro.   Yevonne Pax, MD [facility] Diplomate ABMS Pulmonary Critical Care Medicine and Sleep Medicine

## 2023-05-01 ENCOUNTER — Ambulatory Visit (INDEPENDENT_AMBULATORY_CARE_PROVIDER_SITE_OTHER): Payer: Medicaid Other | Admitting: Internal Medicine

## 2023-05-01 VITALS — BP 152/77 | HR 78 | Resp 16 | Ht 70.0 in | Wt 309.0 lb

## 2023-05-01 DIAGNOSIS — E1159 Type 2 diabetes mellitus with other circulatory complications: Secondary | ICD-10-CM | POA: Diagnosis not present

## 2023-05-01 DIAGNOSIS — Z7189 Other specified counseling: Secondary | ICD-10-CM | POA: Diagnosis not present

## 2023-05-01 DIAGNOSIS — G4733 Obstructive sleep apnea (adult) (pediatric): Secondary | ICD-10-CM

## 2023-05-01 DIAGNOSIS — I152 Hypertension secondary to endocrine disorders: Secondary | ICD-10-CM

## 2023-05-01 NOTE — Patient Instructions (Signed)

## 2024-02-06 ENCOUNTER — Ambulatory Visit (INDEPENDENT_AMBULATORY_CARE_PROVIDER_SITE_OTHER): Admitting: Physician Assistant

## 2024-02-06 ENCOUNTER — Encounter: Payer: Self-pay | Admitting: Physician Assistant

## 2024-02-06 VITALS — BP 125/82 | HR 77 | Temp 97.2°F | Ht 70.0 in | Wt 312.0 lb

## 2024-02-06 DIAGNOSIS — E119 Type 2 diabetes mellitus without complications: Secondary | ICD-10-CM | POA: Diagnosis not present

## 2024-02-06 DIAGNOSIS — Z794 Long term (current) use of insulin: Secondary | ICD-10-CM | POA: Diagnosis not present

## 2024-02-06 DIAGNOSIS — E782 Mixed hyperlipidemia: Secondary | ICD-10-CM | POA: Diagnosis not present

## 2024-02-06 DIAGNOSIS — B350 Tinea barbae and tinea capitis: Secondary | ICD-10-CM | POA: Insufficient documentation

## 2024-02-06 DIAGNOSIS — I10 Essential (primary) hypertension: Secondary | ICD-10-CM

## 2024-02-06 DIAGNOSIS — Z1211 Encounter for screening for malignant neoplasm of colon: Secondary | ICD-10-CM

## 2024-02-06 DIAGNOSIS — E785 Hyperlipidemia, unspecified: Secondary | ICD-10-CM | POA: Insufficient documentation

## 2024-02-06 MED ORDER — TIRZEPATIDE 2.5 MG/0.5ML ~~LOC~~ SOAJ: 2.5000 mg | SUBCUTANEOUS | 0 refills | Status: DC

## 2024-02-06 MED ORDER — TIRZEPATIDE 7.5 MG/0.5ML ~~LOC~~ SOAJ: 7.5000 mg | SUBCUTANEOUS | 1 refills | Status: DC

## 2024-02-06 MED ORDER — ATORVASTATIN CALCIUM 40 MG PO TABS: 40.0000 mg | ORAL_TABLET | Freq: Every day | ORAL | 1 refills | Status: DC

## 2024-02-06 MED ORDER — CLOTRIMAZOLE-BETAMETHASONE 1-0.05 % EX CREA: 1.0000 | TOPICAL_CREAM | Freq: Two times a day (BID) | CUTANEOUS | 1 refills | Status: AC

## 2024-02-06 MED ORDER — TIRZEPATIDE 5 MG/0.5ML ~~LOC~~ SOAJ: 5.0000 mg | SUBCUTANEOUS | 0 refills | Status: DC

## 2024-02-06 NOTE — Progress Notes (Signed)
 New Patient Office Visit  Subjective    Patient ID: Nicholas Bradley, male    DOB: [birth year]  Age: 48 y.o. MRN: [id number]  CC:  Chief Complaint  Patient presents with   Diabetes    HPI Nicholas Bradley presents to establish care  Discussed the use of AI scribe software for clinical note transcription with the patient, who gave verbal consent to proceed.  History of Present Illness Nicholas Bradley is a 48 year old male with diabetes who presents with concerns about his diabetes management.  He manages his diabetes with glimepiride and 60 units of Lantus insulin, split into two doses. Despite this, his blood sugar levels remain elevated at 267 mg/dL at bedtime. He has gained significant weight, from 190 lbs to 312 lbs, since starting insulin and wishes to discontinue its use. He has not tried Mounjaro  or Ozempic  due to concerns about long-term dependency.  He has a past medical history of high blood pressure and high cholesterol, though he recently ran out of his cholesterol medication. He continues to take medication for blood pressure.  He has sleep apnea and uses a CPAP machine nightly. He has a persistent rash on the back of his head and neck, which has not resolved with previous treatments.  Family history includes a father who died of lung cancer with brain metastasis and a mother who had blood clots leading to a stroke. He has no personal history of cancer. He reports no visual changes or tingling in his feet. He plans to schedule an eye exam soon. A colonoscopy between 2021 and 2023 was incomplete, and he needs to reschedule.    Outpatient Encounter Medications as of 02/06/2024  Medication Sig   glimepiride (AMARYL) 4 MG tablet Take 4 mg by mouth daily with breakfast.   losartan (COZAAR) 100 MG tablet Take 100 mg by mouth daily.   tadalafil (CIALIS) 5 MG tablet Take 5 mg by mouth daily.   tirzepatide  (MOUNJARO ) 2.5 MG/0.5ML Pen Inject 2.5 mg into the skin once a week.    tirzepatide  (MOUNJARO ) 5 MG/0.5ML Pen Inject 5 mg into the skin once a week.   tirzepatide  (MOUNJARO ) 7.5 MG/0.5ML Pen Inject 7.5 mg into the skin once a week.   [DISCONTINUED] atorvastatin  (LIPITOR) 40 MG tablet Take 40 mg by mouth at bedtime.   [DISCONTINUED] clotrimazole -betamethasone  (LOTRISONE ) cream Apply 1 Application topically 2 (two) times daily.   [DISCONTINUED] LANTUS SOLOSTAR 100 UNIT/ML Solostar Pen SMARTSIG:30 Unit(s) SUB-Q Twice Daily   atorvastatin  (LIPITOR) 40 MG tablet Take 1 tablet (40 mg total) by mouth at bedtime.   clotrimazole -betamethasone  (LOTRISONE ) cream Apply 1 Application topically 2 (two) times daily.   No facility-administered encounter medications on file as of 02/06/2024.    Past Medical History:  Diagnosis Date   Diabetes mellitus without complication (HCC)    Hyperlipidemia    Hypertension    Left eye pain     Past Surgical History:  Procedure Laterality Date   FINGER NEUROPLASTY Left 2024   nerve repair left ring finger   HERNIA REPAIR      History reviewed. No pertinent family history.  Social History   Socioeconomic History   Marital status: Significant Other    Spouse name: Not on file   Number of children: Not on file   Years of education: Not on file   Highest education level: Not on file  Occupational History   Not on file  Tobacco Use   Smoking status: Former    Current  packs/day: 0.00    Types: Cigarettes    Quit date: 11/14/2014    Years since quitting: 9.2   Smokeless tobacco: Never  Vaping Use   Vaping status: Not on file  Substance and Sexual Activity   Alcohol use: No    Comment: quit 2013   Drug use: No   Sexual activity: Not on file  Other Topics Concern   Not on file  Social History Narrative   Not on file   Social Drivers of Health   Financial Resource Strain: Not on file  Food Insecurity: Not on file  Transportation Needs: Not on file  Physical Activity: Not on file  Stress: Not on file  Social  Connections: Not on file  Intimate Partner Violence: Not on file    Review of Systems  Constitutional:  Negative for chills, fever and malaise/fatigue.  Eyes:  Negative for blurred vision and double vision.  Respiratory:  Negative for cough and shortness of breath.   Cardiovascular:  Negative for chest pain and palpitations.  Musculoskeletal:  Negative for joint pain and myalgias.  Neurological:  Negative for dizziness and headaches.        Objective    BP 125/82   Pulse 77   Temp (!) 97.2 F (36.2 C)   Ht 5' 10 (1.778 m)   Wt (!) 312 lb (141.5 kg)   SpO2 97%   BMI 44.77 kg/m   Physical Exam Constitutional:      Appearance: Normal appearance. He is obese.  HENT:     Head: Normocephalic and atraumatic.     Mouth/Throat:     Mouth: Mucous membranes are moist.     Pharynx: Oropharynx is clear.  Eyes:     Extraocular Movements: Extraocular movements intact.     Conjunctiva/sclera: Conjunctivae normal.  Cardiovascular:     Rate and Rhythm: Normal rate and regular rhythm.     Heart sounds: Normal heart sounds. No murmur heard. Pulmonary:     Effort: Pulmonary effort is normal.     Breath sounds: Normal breath sounds. No wheezing or rales.  Musculoskeletal:     Right lower leg: No edema.     Left lower leg: No edema.  Skin:    General: Skin is warm and dry.  Neurological:     General: No focal deficit present.     Mental Status: He is alert and oriented to person, place, and time.  Psychiatric:        Mood and Affect: Mood normal.        Behavior: Behavior normal.       Assessment & Plan:  Type 2 diabetes mellitus without complication, with long-term current use of insulin (HCC) Assessment & Plan: Suboptimal control with current regimen. Blood sugars around 267 mg/dL at bedtime. Discussed switching to a once-weekly GLP-1 receptor agonist for better glycemic control, weight loss, and cardiovascular and renal protection. Informed about potential side effects and  the challenge of discontinuing insulin after long-term use. - Initiate Mounjaro  once weekly injection, pending insurance approval. - Continue glimepiride daily. - Order A1c to assess current glycemic control. - Follow up in three months to evaluate A1c and adjust treatment as needed. - Educate on potential side effects of Mounjaro  and advise to administer injection on a day when not working to monitor for GI side effects.  Orders: -     Atorvastatin  Calcium ; Take 1 tablet (40 mg total) by mouth at bedtime.  Dispense: 90 tablet; Refill: 1 -  Tirzepatide ; Inject 2.5 mg into the skin once a week.  Dispense: 2 mL; Refill: 0 -     Tirzepatide ; Inject 5 mg into the skin once a week.  Dispense: 2 mL; Refill: 0 -     Tirzepatide ; Inject 7.5 mg into the skin once a week.  Dispense: 2 mL; Refill: 1 -     Microalbumin / creatinine urine ratio -     Lipid panel -     Hemoglobin A1c  Primary hypertension Assessment & Plan: 125/82 Controlled. Continue current medications. No change in management. Discussed DASH diet and dietary sodium restrictions.  Continue dietary efforts and physical activity.   Orders: -     Microalbumin / creatinine urine ratio -     CMP14+EGFR -     CBC with Differential/Platelet  Mixed hyperlipidemia Assessment & Plan: Non-adherence to medication due to lack of refills. Discussed importance of adherence to prevent cardiovascular events. - Refill cholesterol medication and advise daily adherence.  - Lipid panel today.   Orders: -     Atorvastatin  Calcium ; Take 1 tablet (40 mg total) by mouth at bedtime.  Dispense: 90 tablet; Refill: 1 -     Lipid panel  Tinea capitis Assessment & Plan: Chronic with incomplete response to previous treatment. - Refill antifungal steroid cream for topical application twice daily after showering for two weeks, then as needed.  Orders: -     Clotrimazole -Betamethasone ; Apply 1 Application topically 2 (two) times daily.  Dispense:  45 g; Refill: 1  Screen for colon cancer -     Ambulatory referral to Gastroenterology   Return in about 3 months (around 05/07/2024) for DM.   Nicholas Kerria Sapien, PA-C

## 2024-02-06 NOTE — Assessment & Plan Note (Signed)
 125/82 Controlled. Continue current medications. No change in management. Discussed DASH diet and dietary sodium restrictions.  Continue dietary efforts and physical activity.

## 2024-02-06 NOTE — Assessment & Plan Note (Signed)
 Chronic with incomplete response to previous treatment. - Refill antifungal steroid cream for topical application twice daily after showering for two weeks, then as needed.

## 2024-02-06 NOTE — Assessment & Plan Note (Signed)
 Non-adherence to medication due to lack of refills. Discussed importance of adherence to prevent cardiovascular events. - Refill cholesterol medication and advise daily adherence.  - Lipid panel today.

## 2024-02-06 NOTE — Assessment & Plan Note (Signed)
 Suboptimal control with current regimen. Blood sugars around 267 mg/dL at bedtime. Discussed switching to a once-weekly GLP-1 receptor agonist for better glycemic control, weight loss, and cardiovascular and renal protection. Informed about potential side effects and the challenge of discontinuing insulin after long-term use. - Initiate Mounjaro  once weekly injection, pending insurance approval. - Continue glimepiride daily. - Order A1c to assess current glycemic control. - Follow up in three months to evaluate A1c and adjust treatment as needed. - Educate on potential side effects of Mounjaro  and advise to administer injection on a day when not working to monitor for GI side effects.

## 2024-02-07 ENCOUNTER — Ambulatory Visit: Payer: Self-pay | Admitting: Physician Assistant

## 2024-02-07 ENCOUNTER — Telehealth: Payer: Self-pay | Admitting: Pharmacy Technician

## 2024-02-07 ENCOUNTER — Other Ambulatory Visit: Payer: Self-pay | Admitting: Physician Assistant

## 2024-02-07 ENCOUNTER — Other Ambulatory Visit (HOSPITAL_COMMUNITY): Payer: Self-pay

## 2024-02-07 ENCOUNTER — Encounter (INDEPENDENT_AMBULATORY_CARE_PROVIDER_SITE_OTHER): Payer: Self-pay | Admitting: *Deleted

## 2024-02-07 DIAGNOSIS — E119 Type 2 diabetes mellitus without complications: Secondary | ICD-10-CM

## 2024-02-07 LAB — CBC WITH DIFFERENTIAL/PLATELET
Basophils Absolute: 0 x10E3/uL (ref 0.0–0.2)
Basos: 1 %
EOS (ABSOLUTE): 0.1 x10E3/uL (ref 0.0–0.4)
Eos: 1 %
Hematocrit: 49.1 % (ref 37.5–51.0)
Hemoglobin: 16.3 g/dL (ref 13.0–17.7)
Immature Grans (Abs): 0 x10E3/uL (ref 0.0–0.1)
Immature Granulocytes: 0 %
Lymphocytes Absolute: 3.3 x10E3/uL — ABNORMAL HIGH (ref 0.7–3.1)
Lymphs: 46 %
MCH: 31 pg (ref 26.6–33.0)
MCHC: 33.2 g/dL (ref 31.5–35.7)
MCV: 93 fL (ref 79–97)
Monocytes Absolute: 0.7 x10E3/uL (ref 0.1–0.9)
Monocytes: 10 %
Neutrophils Absolute: 3 x10E3/uL (ref 1.4–7.0)
Neutrophils: 42 %
Platelets: 191 x10E3/uL (ref 150–450)
RBC: 5.26 x10E6/uL (ref 4.14–5.80)
RDW: 12.4 % (ref 11.6–15.4)
WBC: 7.1 x10E3/uL (ref 3.4–10.8)

## 2024-02-07 LAB — LIPID PANEL
Chol/HDL Ratio: 7.2 ratio — ABNORMAL HIGH (ref 0.0–5.0)
Cholesterol, Total: 195 mg/dL (ref 100–199)
HDL: 27 mg/dL — ABNORMAL LOW (ref >39)
LDL Chol Calc (NIH): 121 mg/dL — ABNORMAL HIGH (ref 0–99)
Triglycerides: 265 mg/dL — ABNORMAL HIGH (ref 0–149)
VLDL Cholesterol Cal: 47 mg/dL — ABNORMAL HIGH (ref 5–40)

## 2024-02-07 LAB — CMP14+EGFR
ALT: 48 IU/L — ABNORMAL HIGH (ref 0–44)
AST: 29 IU/L (ref 0–40)
Albumin: 4.7 g/dL (ref 4.1–5.1)
Alkaline Phosphatase: 128 IU/L — ABNORMAL HIGH (ref 44–121)
BUN/Creatinine Ratio: 16 (ref 9–20)
BUN: 15 mg/dL (ref 6–24)
Bilirubin Total: 0.3 mg/dL (ref 0.0–1.2)
CO2: 24 mmol/L (ref 20–29)
Calcium: 10 mg/dL (ref 8.7–10.2)
Chloride: 95 mmol/L — ABNORMAL LOW (ref 96–106)
Creatinine, Ser: 0.94 mg/dL (ref 0.76–1.27)
Globulin, Total: 3 g/dL (ref 1.5–4.5)
Glucose: 280 mg/dL — ABNORMAL HIGH (ref 70–99)
Potassium: 4.8 mmol/L (ref 3.5–5.2)
Sodium: 133 mmol/L — ABNORMAL LOW (ref 134–144)
Total Protein: 7.7 g/dL (ref 6.0–8.5)
eGFR: 99 mL/min/1.73 (ref >59)

## 2024-02-07 LAB — MICROALBUMIN / CREATININE URINE RATIO
Creatinine, Urine: 52.8 mg/dL
Microalb/Creat Ratio: <6 mg/g{creat} (ref 0–29)
Microalbumin, Urine: <3 ug/mL

## 2024-02-07 LAB — HEMOGLOBIN A1C
Est. average glucose Bld gHb Est-mCnc: 203 mg/dL
Hgb A1c MFr Bld: 8.7 % — ABNORMAL HIGH (ref 4.8–5.6)

## 2024-02-07 MED ORDER — OZEMPIC (0.25 OR 0.5 MG/DOSE) 2 MG/3ML ~~LOC~~ SOPN: 0.5000 mg | PEN_INJECTOR | SUBCUTANEOUS | 0 refills | Status: AC

## 2024-02-07 MED ORDER — OZEMPIC (0.25 OR 0.5 MG/DOSE) 2 MG/3ML ~~LOC~~ SOPN: 0.2500 mg | PEN_INJECTOR | SUBCUTANEOUS | 0 refills | Status: AC

## 2024-02-07 MED ORDER — SEMAGLUTIDE (1 MG/DOSE) 4 MG/3ML ~~LOC~~ SOPN: 1.0000 mg | PEN_INJECTOR | SUBCUTANEOUS | 0 refills | Status: AC

## 2024-02-07 NOTE — Telephone Encounter (Signed)
 Pharmacy Patient Advocate Encounter   Received notification from CoverMyMeds that prior authorization for Mounjaro  2.5MG /0.5ML auto-injectors is required/requested.   Insurance verification completed.   The patient is insured through [facility] MEDICAID .   Per test claim:  see below is preferred by the insurance.  If suggested medication is appropriate, Please send in a new RX and discontinue this one. If not, please advise as to why it's not appropriate so that we may request a Prior Authorization. Please note, some preferred medications may still require a PA.  If the suggested medications have not been trialed and there are no contraindications to their use, the PA will not be submitted, as it will not be approved.  Patient has it have tried/failed Metformin therapy first. See question below? Has the beneficiary had a trial and failure or insufficient response to metformin containing products? ? Yes ? No  Also mounjaro  is non-preferred. Patient has to try/fail 2 preferred drugs after the trial/failure of metformin. Please advise.

## 2024-02-08 NOTE — Telephone Encounter (Signed)
 Therapy changed to Ozempic .

## 2024-02-08 NOTE — Telephone Encounter (Signed)
-----   Message from La Rue Grooms sent at 02/08/2024  8:57 AM EDT ----- Kidneys are functioning normally, no signs of protein spilling into urine.  ----- Message ----- From: Interface, [facility] Lab Results In Sent: 02/07/2024   7:37 AM EDT To: Charmaine Grooms, PA-C

## 2024-02-08 NOTE — Telephone Encounter (Signed)
 Attempted to reach patient regarding results but no answer or voicemail available.

## 2024-04-11 ENCOUNTER — Other Ambulatory Visit: Payer: Self-pay | Admitting: Physician Assistant

## 2024-04-11 ENCOUNTER — Telehealth: Payer: Self-pay | Admitting: Pharmacy Technician

## 2024-04-11 ENCOUNTER — Other Ambulatory Visit (HOSPITAL_COMMUNITY): Payer: Self-pay

## 2024-04-11 NOTE — Telephone Encounter (Signed)
 Pharmacy Patient Advocate Encounter   Received notification from Onbase that prior authorization for Ozempic  (0.25 or 0.5 MG/DOSE) 2MG /3ML pen-injectors is required/requested.   Insurance verification completed.   The patient is insured through [facility] MEDICAID.   Per test claim: PA required; PA submitted to above mentioned insurance via Latent Key/confirmation #/EOC [id number] Status is pending

## 2024-04-11 NOTE — Telephone Encounter (Signed)
 Pharmacy Patient Advocate Encounter  Received notification from [facility] MEDICAID that Prior Authorization for Ozempic  (0.25 or 0.5 MG/DOSE) 2MG /3ML pen-injectors  has been APPROVED from 04/11/2024 to 04/11/2025. Ran test claim, Copay is $4.00. This test claim was processed through [facility]- copay amounts may vary at other pharmacies due to pharmacy/plan contracts, or as the patient moves through the different stages of their insurance plan.   PA #/Case ID/Reference #: [id number]

## 2024-05-05 NOTE — Progress Notes (Unsigned)
 [facility] [address] [place], [place] [address]  Pulmonary Sleep Medicine   Office Visit Note  Patient Name: Nicholas Bradley DOB: 1976-01-16 MRN [id number]    Chief Complaint: Obstructive Sleep Apnea visit  Brief History:  Nicholas Bradley is seen today for an annual follow up visit for CPAP@ 13 cmH2O. The patient has a 2 year history of sleep apnea. Patient is using PAP nightly.  The patient feels rested after sleeping with PAP.  The patient reports benefiting from PAP use. Reported sleepiness is  improved and the Epworth Sleepiness Score is 2 out of 24. The patient does not take naps. The patient complains of the following: some fatigue. The compliance download shows 96% compliance with an average use time of 7 hours 34 minutes. The AHI is 0.4.  The patient does not complain of limb movements disrupting sleep. The patient continues to require PAP therapy in order to eliminate sleep apnea.   ROS  General: (-) fever, (-) chills, (-) night sweat Nose and Sinuses: (-) nasal stuffiness or itchiness, (-) postnasal drip, (-) nosebleeds, (-) sinus trouble. Mouth and Throat: (-) sore throat, (-) hoarseness. Neck: (-) swollen glands, (-) enlarged thyroid, (-) neck pain. Respiratory: - cough, - shortness of breath, - wheezing. Neurologic: - numbness, - tingling. Psychiatric: - anxiety, - depression   Current Medication: Outpatient Encounter Medications as of 05/06/2024  Medication Sig   amLODipine (NORVASC) 5 MG tablet Take 5 mg by mouth at bedtime.   LANTUS SOLOSTAR 100 UNIT/ML Solostar Pen SMARTSIG:30 Unit(s) SUB-Q Twice Daily   atorvastatin  (LIPITOR) 40 MG tablet Take 1 tablet (40 mg total) by mouth at bedtime.   clotrimazole -betamethasone  (LOTRISONE ) cream Apply 1 Application topically 2 (two) times daily.   glimepiride (AMARYL) 4 MG tablet TAKE ONE TABLET BY MOUTH ONCE DAILY   losartan (COZAAR) 100 MG tablet TAKE ONE TABLET BY MOUTH ONCE DAILY   Semaglutide , 1 MG/DOSE, 4  MG/3ML SOPN Inject 1 mg as directed once a week.   Semaglutide ,0.25 or 0.5MG /DOS, (OZEMPIC , 0.25 OR 0.5 MG/DOSE,) 2 MG/3ML SOPN Inject 0.25 mg into the skin once a week.   Semaglutide ,0.25 or 0.5MG /DOS, (OZEMPIC , 0.25 OR 0.5 MG/DOSE,) 2 MG/3ML SOPN Inject 0.5 mg into the skin once a week.   tadalafil (CIALIS) 5 MG tablet Take 5 mg by mouth daily.   No facility-administered encounter medications on file as of 05/06/2024.    Surgical History: Past Surgical History:  Procedure Laterality Date   FINGER NEUROPLASTY Left 2024   nerve repair left ring finger   HERNIA REPAIR      Medical History: Past Medical History:  Diagnosis Date   Diabetes mellitus without complication (HCC)    Hyperlipidemia    Hypertension    Left eye pain     Family History: Non contributory to the present illness  Social History: Social History   Socioeconomic History   Marital status: Significant Other    Spouse name: Not on file   Number of children: Not on file   Years of education: Not on file   Highest education level: Not on file  Occupational History   Not on file  Tobacco Use   Smoking status: Former    Current packs/day: 0.00    Types: Cigarettes    Quit date: 11/14/2014    Years since quitting: 9.4   Smokeless tobacco: Never  Vaping Use   Vaping status: Not on file  Substance and Sexual Activity   Alcohol use: No    Comment: quit  2013   Drug use: No   Sexual activity: Not on file  Other Topics Concern   Not on file  Social History Narrative   Not on file   Social Drivers of Health   Financial Resource Strain: Not on file  Food Insecurity: Not on file  Transportation Needs: Not on file  Physical Activity: Not on file  Stress: Not on file  Social Connections: Not on file  Intimate Partner Violence: Not on file    Vital Signs: Blood pressure (!) 147/93, pulse 93, resp. rate 18, height 5' 10 (1.778 m), weight (!) 308 lb (139.7 kg), SpO2 96%. Body mass index is 44.19 kg/m.     Examination: General Appearance: The patient is well-developed, well-nourished, and in no distress. Neck Circumference: 53 cm Skin: Gross inspection of skin unremarkable. Head: normocephalic, no gross deformities. Eyes: no gross deformities noted. ENT: ears appear grossly normal Neurologic: Alert and oriented. No involuntary movements.  STOP BANG RISK ASSESSMENT S (snore) Have you been told that you snore?     NO   T (tired) Are you often tired, fatigued, or sleepy during the day?   NO  O (obstruction) Do you stop breathing, choke, or gasp during sleep? NO   P (pressure) Do you have or are you being treated for high blood pressure? YES   B (BMI) Is your body index greater than 35 kg/m? YES   A (age) Are you 68 years old or older? NO   N (neck) Do you have a neck circumference greater than 16 inches?   YES   G (gender) Are you a male? YES   TOTAL STOP/BANG "YES" ANSWERS 4       A STOP-Bang score of 2 or less is considered low risk, and a score of 5 or more is high risk for having either moderate or severe OSA. For people who score 3 or 4, doctors may need to perform further assessment to determine how likely they are to have OSA.         EPWORTH SLEEPINESS SCALE:  Scale:  (0)= no chance of dozing; (1)= slight chance of dozing; (2)= moderate chance of dozing; (3)= high chance of dozing  Chance  Situtation    Sitting and reading: 1    Watching TV: 0    Sitting Inactive in public: 0    As a passenger in car: 0      Lying down to rest: 1    Sitting and talking: 0    Sitting quielty after lunch: 0    In a car, stopped in traffic: 0   TOTAL SCORE:   2 out of 24    SLEEP STUDIES:  PSG (05/2022) AHI 18/hr, REM AHI 42/hr, min SpO2 85% Titration (05/2022) CPAP@ 13 cmH2O   CPAP COMPLIANCE DATA:  Date Range: 05/02/2023- 04/30/2024  Average Daily Use: 7 hours 34 minutes  Median Use: 7 hours 41 minutes  Compliance for > 4 Hours: 96%  AHI: 0.4  respiratory events per hour  Days Used: 359/365 days  Mask Leak: 17.3  95th Percentile Pressure: 13         LABS: No results found for this or any previous visit (from the past 2160 hours).   Radiology: [facility] Chest Port 1 View Result Date: 01/20/2020 CLINICAL DATA:  48 year old male with chest pain and shortness of breath. EXAM: PORTABLE CHEST 1 VIEW COMPARISON:  None. FINDINGS: Shallow inspiration. No focal consolidation, pleural effusion or pneumothorax. Chest top-normal cardiac size. No  acute osseous pathology. IMPRESSION: No active disease. Electronically Signed   By: Vanetta Chou M.D.   On: 01/20/2020 17:41    No results found.  No results found.    Assessment and Plan: Patient Active Problem List   Diagnosis Date Noted   Type 2 diabetes mellitus (HCC) 02/06/2024   HLD (hyperlipidemia) 02/06/2024   Tinea capitis 02/06/2024   CPAP use counseling 10/24/2022   OSA (obstructive sleep apnea) 03/28/2022   HTN (hypertension) 03/28/2022   Morbid obesity (HCC) 03/28/2022   1. OSA (obstructive sleep apnea) (Primary) The patient does tolerate PAP and reports  benefit from PAP use. The patient was reminded how to clean equipment and advised to replace supplies routinely. The patient was also counselled on weight loss. He has recently started ozempic . He will discuss possible Mounjaro  with his doctor if he doesn't see weight loss with the semaglutide , as he would have benefit for his OSA. The compliance is excellent. The AHI is 0.4.   OSA on cpap- controlled. Continue with excellent compliance with pap. CPAP continues to be medically necessary to treat this patient's OSA. F/u one year.    2. CPAP use counseling CPAP Counseling: had a lengthy discussion with the patient regarding the importance of PAP therapy in management of the sleep apnea. Patient appears to understand the risk factor reduction and also understands the risks associated with untreated sleep apnea. Patient  will try to make a good faith effort to remain compliant with therapy. Also instructed the patient on proper cleaning of the device including the water must be changed daily if possible and use of distilled water is preferred. Patient understands that the machine should be regularly cleaned with appropriate recommended cleaning solutions that do not damage the PAP machine for example given white vinegar and water rinses. Other methods such as ozone treatment may not be as good as these simple methods to achieve cleaning.   3. Hypertension associated with diabetes (HCC) Controlled with losartan and amlodipine, continue.   4. Morbid obesity (HCC) Obesity Counseling: Had a lengthy discussion regarding patients BMI and weight issues. Patient was instructed on portion control as well as increased activity. Also discussed caloric restrictions with trying to maintain intake less than 2000 Kcal. Discussions were made in accordance with the 5As of weight management. Simple actions such as not eating late and if able to, taking a walk is suggested.      General Counseling: I have discussed the findings of the evaluation and examination with Mattix.  I have also discussed any further diagnostic evaluation thatmay be needed or ordered today. Azaan verbalizes understanding of the findings of todays visit. We also reviewed his medications today and discussed drug interactions and side effects including but not limited excessive drowsiness and altered mental states. We also discussed that there is always a risk not just to him but also people around him. he has been encouraged to call the office with any questions or concerns that should arise related to todays visit.  No orders of the defined types were placed in this encounter.       I have personally obtained a history, examined the patient, evaluated laboratory and imaging results, formulated the assessment and plan and placed orders. This patient was seen  today by Lauraine Lay, PA-C in collaboration with Dr. Elfreda Bathe.   Elfreda DELENA Bathe, MD [facility] Diplomate ABMS Pulmonary Critical Care Medicine and Sleep Medicine

## 2024-05-06 ENCOUNTER — Ambulatory Visit: Admitting: Internal Medicine

## 2024-05-06 VITALS — BP 147/93 | HR 93 | Resp 18 | Ht 70.0 in | Wt 308.0 lb

## 2024-05-06 DIAGNOSIS — I152 Hypertension secondary to endocrine disorders: Secondary | ICD-10-CM

## 2024-05-06 DIAGNOSIS — E1159 Type 2 diabetes mellitus with other circulatory complications: Secondary | ICD-10-CM

## 2024-05-06 DIAGNOSIS — G4733 Obstructive sleep apnea (adult) (pediatric): Secondary | ICD-10-CM

## 2024-05-06 DIAGNOSIS — Z7189 Other specified counseling: Secondary | ICD-10-CM

## 2024-05-06 NOTE — Patient Instructions (Signed)

## 2024-05-07 ENCOUNTER — Encounter: Payer: Self-pay | Admitting: Physician Assistant

## 2024-05-07 ENCOUNTER — Ambulatory Visit: Admitting: Physician Assistant

## 2024-05-07 VITALS — BP 128/82 | HR 77 | Ht 70.0 in | Wt 308.5 lb

## 2024-05-07 DIAGNOSIS — E119 Type 2 diabetes mellitus without complications: Secondary | ICD-10-CM

## 2024-05-07 DIAGNOSIS — Z23 Encounter for immunization: Secondary | ICD-10-CM

## 2024-05-07 DIAGNOSIS — E782 Mixed hyperlipidemia: Secondary | ICD-10-CM

## 2024-05-07 DIAGNOSIS — I10 Essential (primary) hypertension: Secondary | ICD-10-CM

## 2024-05-07 MED ORDER — ATORVASTATIN CALCIUM 40 MG PO TABS: 40.0000 mg | ORAL_TABLET | Freq: Every day | ORAL | 1 refills | Status: AC

## 2024-05-07 NOTE — Assessment & Plan Note (Signed)
 Managed with glimepiride, insulin, and Ozempic . Blood sugars range from 212 to 230 mg/dL at home. A1c expected to be stable around 8.5%, as he recently started GLP1. - Continue glimepiride 4 mg oral daily. - Continue insulin 30 units twice a day. - Continue Ozempic  0.25 mg subcutaneous weekly for one month, then increase to 0.5 mg weekly for one month, and then to 1 mg weekly for one month. - Repeated A1c today. - Scheduled follow-up in three months to assess diabetes management and adjust treatment as needed. - Encouraged annual eye exams to monitor for diabetic retinopathy and advised to increase lifestyle efforts.

## 2024-05-07 NOTE — Progress Notes (Signed)
 Established Patient Office Visit  Subjective   Patient ID: Nicholas Bradley, male    DOB: 05-10-1976  Age: 48 y.o. MRN: [id number]  Chief Complaint  Patient presents with   Follow-up    Patient is here for a 3 month follow up. Needing medication refill on statin    Discussed the use of AI scribe software for clinical note transcription with the patient, who gave verbal consent to proceed.  History of Present Illness Nicholas Bradley is a 48 year old male with diabetes and hypertension who presents for follow-up on blood sugars and blood pressure management.  He is on his second dose of a once-weekly 0.25 mg injection for diabetes and has no nausea, vomiting, or abdominal pain. He also takes glimepiride and uses insulin 30 units twice daily. Home blood sugars range from 212 to 230 mg/dL. His diet includes frequent pastas, rice, and bread, and he avoids sugary drinks and most sweets except occasional holiday foods.  He has a home blood pressure machine but does not check regularly. Denies chest pain, shortness of breath or headache. He takes medications for blood pressure and cholesterol and recently stopped sildenafil because it was not effective.  He receives annual flu shots. He has not seen an eye doctor recently. He denies foot numbness, tingling, or sores.    Review of Systems  Constitutional:  Negative for chills, fever and malaise/fatigue.  Eyes:  Negative for blurred vision and double vision.  Respiratory:  Negative for cough and shortness of breath.   Cardiovascular:  Negative for chest pain and palpitations.  Gastrointestinal:  Negative for abdominal pain, constipation, nausea and vomiting.      Objective:     BP 128/82   Pulse 77   Ht 5' 10 (1.778 m)   Wt (!) 308 lb 8 oz (139.9 kg)   SpO2 95%   BMI 44.27 kg/m    Physical Exam Constitutional:      General: He is not in acute distress.    Appearance: Normal appearance. He is obese. He is not ill-appearing.   HENT:     Head: Normocephalic and atraumatic.     Mouth/Throat:     Mouth: Mucous membranes are moist.     Pharynx: Oropharynx is clear.  Eyes:     Extraocular Movements: Extraocular movements intact.     Conjunctiva/sclera: Conjunctivae normal.  Cardiovascular:     Rate and Rhythm: Normal rate and regular rhythm.     Heart sounds: Normal heart sounds. No murmur heard. Pulmonary:     Effort: Pulmonary effort is normal.     Breath sounds: Normal breath sounds.  Musculoskeletal:     Right lower leg: No edema.     Left lower leg: No edema.  Skin:    General: Skin is warm and dry.  Neurological:     General: No focal deficit present.     Mental Status: He is alert and oriented to person, place, and time.  Psychiatric:        Mood and Affect: Mood normal.        Behavior: Behavior normal.     No results found for any visits on 05/07/24.  The 10-year ASCVD risk score (Arnett DK, et al., 2019) is: 18.1%    Assessment & Plan:   Return in about 3 months (around 08/05/2024) for DM.   Type 2 diabetes mellitus without complication, with long-term current use of insulin (HCC) Assessment & Plan: Managed with glimepiride, insulin, and Ozempic . Blood sugars  range from 212 to 230 mg/dL at home. A1c expected to be stable around 8.5%, as he recently started GLP1. - Continue glimepiride 4 mg oral daily. - Continue insulin 30 units twice a day. - Continue Ozempic  0.25 mg subcutaneous weekly for one month, then increase to 0.5 mg weekly for one month, and then to 1 mg weekly for one month. - Repeated A1c today. - Scheduled follow-up in three months to assess diabetes management and adjust treatment as needed. - Encouraged annual eye exams to monitor for diabetic retinopathy and advised to increase lifestyle efforts.   Orders: -     Atorvastatin  Calcium ; Take 1 tablet (40 mg total) by mouth at bedtime.  Dispense: 90 tablet; Refill: 1 -     Hemoglobin A1c  Mixed hyperlipidemia Assessment  & Plan: Stable. Continue with current management without changes. Medication refilled today.  Discussed healthy diet and lifestyle.   Orders: -     Atorvastatin  Calcium ; Take 1 tablet (40 mg total) by mouth at bedtime.  Dispense: 90 tablet; Refill: 1  Primary hypertension Assessment & Plan: 128/82 Controlled. Continue current medications. No change in management. Discussed DASH diet and dietary sodium restrictions.  Increase dietary efforts and physical activity.    Immunization due -     Flu vaccine trivalent PF, 6mos and older(Flulaval,Afluria,Fluarix,Fluzone)     [employer], PA-C

## 2024-05-07 NOTE — Assessment & Plan Note (Signed)
 Stable. Continue with current management without changes. Medication refilled today.  Discussed healthy diet and lifestyle.

## 2024-05-07 NOTE — Assessment & Plan Note (Signed)
 128/82 Controlled. Continue current medications. No change in management. Discussed DASH diet and dietary sodium restrictions.  Increase dietary efforts and physical activity.

## 2024-05-08 ENCOUNTER — Ambulatory Visit: Payer: Self-pay | Admitting: Physician Assistant

## 2024-05-08 LAB — HEMOGLOBIN A1C
Est. average glucose Bld gHb Est-mCnc: 237 mg/dL
Hgb A1c MFr Bld: 9.9 % — ABNORMAL HIGH (ref 4.8–5.6)

## 2024-08-06 ENCOUNTER — Ambulatory Visit: Admitting: Physician Assistant
